# Patient Record
Sex: Female | Born: 1970 | Race: Black or African American | Hispanic: No | Marital: Single | Smoking: Former smoker
Health system: Southern US, Community
[De-identification: ages and names within clinical notes are randomized; demographics above are authoritative.]

## PROBLEM LIST (undated history)

## (undated) DIAGNOSIS — I10 Essential (primary) hypertension: Secondary | ICD-10-CM

## (undated) DIAGNOSIS — E785 Hyperlipidemia, unspecified: Secondary | ICD-10-CM

## (undated) DIAGNOSIS — K219 Gastro-esophageal reflux disease without esophagitis: Secondary | ICD-10-CM

## (undated) DIAGNOSIS — E119 Type 2 diabetes mellitus without complications: Secondary | ICD-10-CM

## (undated) HISTORY — PX: TONSILLECTOMY: SUR1361

## (undated) HISTORY — PX: LEG TENDON SURGERY: SHX1004

## (undated) HISTORY — PX: OTHER SURGICAL HISTORY: SHX169

## (undated) HISTORY — PX: KNEE SURGERY: SHX244

## (undated) HISTORY — PX: ABDOMINAL HYSTERECTOMY: SHX81

---

## 2013-04-14 ENCOUNTER — Emergency Department: Payer: Self-pay | Admitting: Emergency Medicine

## 2013-04-17 LAB — BETA STREP CULTURE(ARMC)

## 2013-06-20 ENCOUNTER — Emergency Department: Payer: Self-pay | Admitting: Emergency Medicine

## 2013-06-20 LAB — CBC
HCT: 38.9 % (ref 35.0–47.0)
HGB: 12.2 g/dL (ref 12.0–16.0)
MCH: 25.9 pg — ABNORMAL LOW (ref 26.0–34.0)
MCHC: 31.2 g/dL — ABNORMAL LOW (ref 32.0–36.0)
MCV: 83 fL (ref 80–100)
Platelet: 318 10*3/uL (ref 150–440)
RBC: 4.7 10*6/uL (ref 3.80–5.20)
RDW: 14.8 % — AB (ref 11.5–14.5)
WBC: 8.3 10*3/uL (ref 3.6–11.0)

## 2013-06-20 LAB — DRUG SCREEN, URINE
Amphetamines, Ur Screen: NEGATIVE (ref ?–1000)
Barbiturates, Ur Screen: NEGATIVE (ref ?–200)
Benzodiazepine, Ur Scrn: NEGATIVE (ref ?–200)
Cannabinoid 50 Ng, Ur ~~LOC~~: NEGATIVE (ref ?–50)
Cocaine Metabolite,Ur ~~LOC~~: POSITIVE (ref ?–300)
MDMA (ECSTASY) UR SCREEN: NEGATIVE (ref ?–500)
Methadone, Ur Screen: NEGATIVE (ref ?–300)
OPIATE, UR SCREEN: NEGATIVE (ref ?–300)
PHENCYCLIDINE (PCP) UR S: NEGATIVE (ref ?–25)
Tricyclic, Ur Screen: NEGATIVE (ref ?–1000)

## 2013-06-20 LAB — ETHANOL: Ethanol: <3 mg/dL

## 2013-06-20 LAB — COMPREHENSIVE METABOLIC PANEL
AST: 17 U/L (ref 15–37)
Albumin: 4 g/dL (ref 3.4–5.0)
Alkaline Phosphatase: 94 U/L
Anion Gap: 7 (ref 7–16)
BILIRUBIN TOTAL: 0.3 mg/dL (ref 0.2–1.0)
BUN: 11 mg/dL (ref 7–18)
Calcium, Total: 9.4 mg/dL (ref 8.5–10.1)
Chloride: 105 mmol/L (ref 98–107)
Co2: 26 mmol/L (ref 21–32)
Creatinine: 1.38 mg/dL — ABNORMAL HIGH (ref 0.60–1.30)
EGFR (African American): 55 — ABNORMAL LOW
GFR CALC NON AF AMER: 47 — AB
Glucose: 89 mg/dL (ref 65–99)
OSMOLALITY: 275 (ref 275–301)
Potassium: 3.7 mmol/L (ref 3.5–5.1)
SGPT (ALT): 21 U/L (ref 12–78)
SODIUM: 138 mmol/L (ref 136–145)
Total Protein: 8 g/dL (ref 6.4–8.2)

## 2013-06-20 LAB — URINALYSIS, COMPLETE
Bilirubin,UR: NEGATIVE
Glucose,UR: NEGATIVE mg/dL (ref 0–75)
KETONE: NEGATIVE
LEUKOCYTE ESTERASE: NEGATIVE
NITRITE: NEGATIVE
Ph: 7 (ref 4.5–8.0)
Protein: NEGATIVE
RBC,UR: 25 /HPF (ref 0–5)
SPECIFIC GRAVITY: 1.006 (ref 1.003–1.030)

## 2013-06-20 LAB — SALICYLATE LEVEL: Salicylates, Serum: <1.7 mg/dL

## 2013-06-20 LAB — ACETAMINOPHEN LEVEL: Acetaminophen: <2 ug/mL

## 2013-09-19 ENCOUNTER — Emergency Department: Payer: Self-pay | Admitting: Emergency Medicine

## 2013-09-19 LAB — URINALYSIS, COMPLETE
Bilirubin,UR: NEGATIVE
Glucose,UR: NEGATIVE mg/dL (ref 0–75)
Ketone: NEGATIVE
Nitrite: NEGATIVE
Ph: 6 (ref 4.5–8.0)
Protein: NEGATIVE
RBC,UR: 1 /HPF (ref 0–5)
SPECIFIC GRAVITY: 1.001 (ref 1.003–1.030)
Squamous Epithelial: 1

## 2013-10-06 ENCOUNTER — Emergency Department: Payer: Self-pay | Admitting: Emergency Medicine

## 2013-10-06 LAB — DRUG SCREEN, URINE
Amphetamines, Ur Screen: NEGATIVE (ref ?–1000)
BENZODIAZEPINE, UR SCRN: NEGATIVE (ref ?–200)
Barbiturates, Ur Screen: NEGATIVE (ref ?–200)
Cannabinoid 50 Ng, Ur ~~LOC~~: NEGATIVE (ref ?–50)
Cocaine Metabolite,Ur ~~LOC~~: POSITIVE (ref ?–300)
MDMA (ECSTASY) UR SCREEN: NEGATIVE (ref ?–500)
Methadone, Ur Screen: NEGATIVE (ref ?–300)
Opiate, Ur Screen: NEGATIVE (ref ?–300)
PHENCYCLIDINE (PCP) UR S: NEGATIVE (ref ?–25)
TRICYCLIC, UR SCREEN: NEGATIVE (ref ?–1000)

## 2013-10-06 LAB — SALICYLATE LEVEL: Salicylates, Serum: <1.7 mg/dL

## 2013-10-06 LAB — URINALYSIS, COMPLETE
BACTERIA: NONE SEEN
BILIRUBIN, UR: NEGATIVE
GLUCOSE, UR: NEGATIVE mg/dL (ref 0–75)
Ketone: NEGATIVE
Leukocyte Esterase: NEGATIVE
Nitrite: NEGATIVE
Ph: 6 (ref 4.5–8.0)
Protein: 30
RBC,UR: 3 /HPF (ref 0–5)
SPECIFIC GRAVITY: 1.029 (ref 1.003–1.030)
Squamous Epithelial: 24
WBC UR: 17 /HPF (ref 0–5)

## 2013-10-06 LAB — COMPREHENSIVE METABOLIC PANEL
ALBUMIN: 4 g/dL (ref 3.4–5.0)
ALK PHOS: 87 U/L
ALT: 22 U/L (ref 12–78)
ANION GAP: 3 — AB (ref 7–16)
BUN: 7 mg/dL (ref 7–18)
Bilirubin,Total: 0.3 mg/dL (ref 0.2–1.0)
CHLORIDE: 106 mmol/L (ref 98–107)
Calcium, Total: 9.1 mg/dL (ref 8.5–10.1)
Co2: 31 mmol/L (ref 21–32)
Creatinine: 1.61 mg/dL — ABNORMAL HIGH (ref 0.60–1.30)
EGFR (Non-African Amer.): 39 — ABNORMAL LOW
GFR CALC AF AMER: 45 — AB
Glucose: 75 mg/dL (ref 65–99)
Osmolality: 276 (ref 275–301)
Potassium: 3.5 mmol/L (ref 3.5–5.1)
SGOT(AST): 20 U/L (ref 15–37)
Sodium: 140 mmol/L (ref 136–145)
TOTAL PROTEIN: 8.4 g/dL — AB (ref 6.4–8.2)

## 2013-10-06 LAB — CBC
HCT: 42.5 % (ref 35.0–47.0)
HGB: 13.3 g/dL (ref 12.0–16.0)
MCH: 26 pg (ref 26.0–34.0)
MCHC: 31.4 g/dL — ABNORMAL LOW (ref 32.0–36.0)
MCV: 83 fL (ref 80–100)
Platelet: 339 10*3/uL (ref 150–440)
RBC: 5.13 10*6/uL (ref 3.80–5.20)
RDW: 14 % (ref 11.5–14.5)
WBC: 6.5 10*3/uL (ref 3.6–11.0)

## 2013-10-06 LAB — ETHANOL: Ethanol: <3 mg/dL

## 2013-10-06 LAB — ACETAMINOPHEN LEVEL: Acetaminophen: <2 ug/mL

## 2013-10-11 LAB — URINALYSIS, COMPLETE
BLOOD: NEGATIVE
Bilirubin,UR: NEGATIVE
Glucose,UR: NEGATIVE mg/dL (ref 0–75)
Ketone: NEGATIVE
Leukocyte Esterase: NEGATIVE
NITRITE: NEGATIVE
PH: 6 (ref 4.5–8.0)
Protein: NEGATIVE
Specific Gravity: 1.012 (ref 1.003–1.030)

## 2013-10-11 LAB — ETHANOL
Ethanol %: <0.003 % (ref 0.000–0.080)
Ethanol: <3 mg/dL

## 2013-10-11 LAB — COMPREHENSIVE METABOLIC PANEL
ALBUMIN: 3.6 g/dL (ref 3.4–5.0)
ANION GAP: 6 — AB (ref 7–16)
AST: 16 U/L (ref 15–37)
Alkaline Phosphatase: 81 U/L
BUN: 7 mg/dL (ref 7–18)
Bilirubin,Total: 0.4 mg/dL (ref 0.2–1.0)
CALCIUM: 9.1 mg/dL (ref 8.5–10.1)
Chloride: 110 mmol/L — ABNORMAL HIGH (ref 98–107)
Co2: 28 mmol/L (ref 21–32)
Creatinine: 1.19 mg/dL (ref 0.60–1.30)
EGFR (Non-African Amer.): 56 — ABNORMAL LOW
Glucose: 100 mg/dL — ABNORMAL HIGH (ref 65–99)
OSMOLALITY: 285 (ref 275–301)
Potassium: 3.8 mmol/L (ref 3.5–5.1)
SGPT (ALT): 20 U/L (ref 12–78)
Sodium: 144 mmol/L (ref 136–145)
Total Protein: 7.8 g/dL (ref 6.4–8.2)

## 2013-10-11 LAB — DRUG SCREEN, URINE
Amphetamines, Ur Screen: NEGATIVE (ref ?–1000)
BARBITURATES, UR SCREEN: NEGATIVE (ref ?–200)
BENZODIAZEPINE, UR SCRN: NEGATIVE (ref ?–200)
Cannabinoid 50 Ng, Ur ~~LOC~~: NEGATIVE (ref ?–50)
Cocaine Metabolite,Ur ~~LOC~~: POSITIVE (ref ?–300)
MDMA (Ecstasy)Ur Screen: NEGATIVE (ref ?–500)
METHADONE, UR SCREEN: NEGATIVE (ref ?–300)
Opiate, Ur Screen: NEGATIVE (ref ?–300)
PHENCYCLIDINE (PCP) UR S: NEGATIVE (ref ?–25)
Tricyclic, Ur Screen: NEGATIVE (ref ?–1000)

## 2013-10-11 LAB — CBC
HCT: 38.5 % (ref 35.0–47.0)
HGB: 12.4 g/dL (ref 12.0–16.0)
MCH: 26.6 pg (ref 26.0–34.0)
MCHC: 32.2 g/dL (ref 32.0–36.0)
MCV: 83 fL (ref 80–100)
PLATELETS: 300 10*3/uL (ref 150–440)
RBC: 4.66 10*6/uL (ref 3.80–5.20)
RDW: 13.9 % (ref 11.5–14.5)
WBC: 6.9 10*3/uL (ref 3.6–11.0)

## 2013-10-11 LAB — SALICYLATE LEVEL: Salicylates, Serum: <1.7 mg/dL

## 2013-10-11 LAB — ACETAMINOPHEN LEVEL: Acetaminophen: <2 ug/mL

## 2013-10-11 LAB — TROPONIN I

## 2013-10-12 ENCOUNTER — Inpatient Hospital Stay: Payer: Self-pay | Admitting: Psychiatry

## 2013-10-12 LAB — TROPONIN I

## 2013-10-14 LAB — HEMOGLOBIN A1C: Hemoglobin A1C: 6.1 % (ref 4.2–6.3)

## 2014-04-04 ENCOUNTER — Emergency Department: Payer: Self-pay | Admitting: Emergency Medicine

## 2014-04-04 LAB — BASIC METABOLIC PANEL
ANION GAP: 5 — AB (ref 7–16)
BUN: 10 mg/dL (ref 7–18)
CO2: 24 mmol/L (ref 21–32)
Calcium, Total: 8.7 mg/dL (ref 8.5–10.1)
Chloride: 111 mmol/L — ABNORMAL HIGH (ref 98–107)
Creatinine: 1.22 mg/dL (ref 0.60–1.30)
EGFR (Non-African Amer.): 51 — ABNORMAL LOW
Glucose: 96 mg/dL (ref 65–99)
OSMOLALITY: 278 (ref 275–301)
POTASSIUM: 4.3 mmol/L (ref 3.5–5.1)
SODIUM: 140 mmol/L (ref 136–145)

## 2014-04-04 LAB — CBC
HCT: 39.5 % (ref 35.0–47.0)
HGB: 12.4 g/dL (ref 12.0–16.0)
MCH: 26.9 pg (ref 26.0–34.0)
MCHC: 31.4 g/dL — AB (ref 32.0–36.0)
MCV: 86 fL (ref 80–100)
PLATELETS: 316 10*3/uL (ref 150–440)
RBC: 4.59 10*6/uL (ref 3.80–5.20)
RDW: 14.3 % (ref 11.5–14.5)
WBC: 5.8 10*3/uL (ref 3.6–11.0)

## 2014-04-04 LAB — TROPONIN I

## 2014-05-12 ENCOUNTER — Ambulatory Visit: Payer: Self-pay | Admitting: Nurse Practitioner

## 2014-08-01 NOTE — H&P (Signed)
PATIENT NAME:  Rachael Strickland, Rachael Strickland MR#:  [id number] DATE OF BIRTH:  1971-01-28  DATE OF ADMISSION:  10/12/2013  SEX: Female.  RACE: African-American.  AGE: 44 years.  INITIAL PSYCHIATRIC EVALUATION:  IDENTIFYING INFORMATION: The patient is a 44 year old African-American female who is not employed, not married and has been living with current boyfriend for 3 years. The patient has a long history of mental illness and had several inpatient hospitalizations on psychiatry for the same. The patient was brought to the Emergency Room at [facility] by her boyfriend after he took away a bottle of sugar pills from her, as she wanted to overdose herself on sugar pills so that her blood sugar would become very low and she would collapse and die.  HISTORY OF PRESENT ILLNESS: The patient reports that she has been feeling stressed out and depressed because she is not able to really talk to any of her family members including her mother, who do not want to talk to her. She is not able to see her 2 children, who live with their daddy and he will not let her see them.   PAST PSYCHIATRIC HISTORY: Several inpatient hospitalizations in psychiatry; once in [place], [place], for 3 months because of suicidal ideas and was inpatient for 3 days at [facility] system in the past. History of suicide attempts on several occasions by overdose on pills, but some of them were not documented. Not being followed by any psychiatrist at this time.   FAMILY HISTORY OF MENTAL ILLNESS: None known. No known history of suicide in the family.  FAMILY HISTORY: Parents got divorced with the patient was very young; was raised by her mother. Mother worked. Mother is living, but she will not talk to her. The patient has 2 sisters and 1 brother died this month and this caused her to become depressed with complicated bereavement as in not able to deal with the same.   PERSONAL HISTORY: Born in [place], [place]. Graduated from high school.  No college.   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 Quit working because her children's father cheated on her and she could not deal with it any more. Last worked in 2008.  MILITARY HISTORY: None.  MARRIAGES: Never married. Has 2 children and they live with their father who will not let her see them.  ALCOHOL AND DRUGS: Denies any alcohol. Admits that she tried cocaine recently on a couple of occasions. Denies smoking nicotine cigarettes.  MEDICAL HISTORY: Has high blood pressure, has diabetes mellitus. Status post hysterectomy, 6 surgeries on the right knee, status post carpal tunnel surgery on both wrists, status post tonsillectomy. No history of motor vehicle accident, never been unconscious.  ALLERGIES: ALLERGIC TO SULFA DRUGS.  Doctor in [place] was giving her medication for hypertension and diabetes, and has to find a doctor in [place] soon.  PHYSICAL EXAMINATION: VITAL SIGNS: Temperature 98.4, pulse is 82 per minute and regular, respirations 20 per minute and regular, blood pressure is 130/80 mmHg. HEENT: Normocephalic, atraumatic. Eyes: PERRLA. NECK: Supple without any thyromegaly. CHEST: Normal expansion, normal breath sounds. HEART: Normal S1 without any murmurs, rubs, or gallops.  ABDOMEN: Soft, no organomegaly. Bowel sounds heard. RECTAL/PELVIC: Deferred. NEUROLOGIC: Gait is normal. Romberg is negative. Cranial nerves II through XII grossly intact. DTRs 2+ normal.  MENTAL STATUS EXAMINATION: The patient is dressed in hospital scrubs, alert and oriented to place, person and time, appears upset and irritable, as she did not want to stay here, as she changed her  mind and she wanted to go home. Affect is flat. Mood restricted and irritable about her situation in life, which is going to her brother's death and not able to see her children and not able to relate with her family. Feels hopeless and helpless. Feels worthless and useless at times. Did express suicidal  wishes and thoughts, though she contracts for safety. No psychosis. Does not appear to be responding to internal stimuli. Cognition intact. Insight and judgment poor. Impulse control poor.  IMPRESSION:  AXIS I:  Major depressive disorder, recurrent, with suicidal ideas.  Cocaine abuse and tried it on couple of occasions. Bereavement after death of her brother recently. AXIS II: Deferred. AXIS III:  Status post tonsillectomy.  Status post 6 surgeries on right knee. Status post hysterectomy. Hypertension. Diabetes mellitus. AXIS IV: Severe. Not able to relate with her family members as well as physical problems. Not able to see her children and recent death of brother and dealing with bereavement. AXIS V: GAF of 25.  PLAN: The patient is admitted to [facility] for a closer evaluation and help. She will be started back on her medications which will be adjusted during her stay in the hospital. During her stay in the hospital, she will be given milieu therapy and supportive counseling with coping skills in dealing with the death of her brother and grief counseling will be given. Social services will try to contact the family to see if there is any family counseling that can be given. At the time of discharge, the patient is stabilized. An appropriate followup appointment will be made in the community.    ____________________________ Jannet MantisSurya K. Guss Bundehalla, MD skc:lm D: 10/12/2013 19:50:09 ET T: 10/12/2013 20:16:23 ET JOB#: [id number]  cc: Monika SalkSurya K. Guss Bundehalla, MD, <Dictator> Beau FannySURYA K Jaidence Geisler MD ELECTRONICALLY SIGNED 10/13/2013 11:29

## 2014-08-01 NOTE — Consult Note (Signed)
PATIENT NAME:  Rachael Strickland, Ambra MR#:  [id number] DATE OF BIRTH:  11/16/1970  DATE OF CONSULTATION:  10/11/2013  CONSULTING PHYSICIAN:  Tandre Conly K. Deolinda Frid, MD  SEX: Female.  RACE: African American.  SUBJECTIVE: The patient was seen in consultation in [facility] Emergency Room 25. The patient is a 44 year old African American female not employed, not married, and has been living with current boyfriend for 3 years. The patient has a long history of mental illness and has several inpatient holds on psychiatry for the same. The patient was [facility] by her boyfriend after he took away a bottle of sugar pills from her as she wanted to overdose herself on sugar pills so that her blood sugar would become very low and she collapses and she dies.  CHIEF COMPLAINT: "I'm feeling depressed and stressed out about various stressors of life."  HISTORY OF PRESENT ILLNESS: The patient reports that she is not able to relate or talk to any of her family members as her mother does not talk to her. She is not able to see her 2 children who live with their daddy and he will not let her see them.  PAST PSYCHIATRIC HISTORY: Several inpatient holds on psychiatry. Once before in [place], for 3 months because of suicidal ideas, was [facility]. History of suicide attempts on several occasions by overdose on pills but some of them were not documented. Not being followed by any psychiatrist at this time.  ALCOHOL AND DRUGS: Denies drinking alcohol. Admits that she tried cocaine recently on a couple of occasions.   MENTAL STATUS EXAMINATION: The patient is alert and oriented to place, person, and time. Aware of situation brought her to [facility]. Affect is flat. Mood restricted. Admits feeling hopeless and helpless, admits feeling worthless and useless. Does have suicidal wishes and thoughts and does not contract for safety. No psychosis. Denies auditory or visual  hallucinations, delusions or paranoid thinking. Memory is intact. Cognition intact. Insight and judgment guarded. Impulse control poor.   IMPRESSION: Major depressive disorder, recurrent, with suicidal ideas but contracts for safety as long as she is here.  RECOMMEND: Inpatient hold on psychiatry for closer observation eval and help. Behavioral Health and stabilization.    ____________________________ Jannet MantisSurya K. Guss Bundehalla, MD skc:lt D: 10/11/2013 18:36:26 ET T: 10/12/2013 00:26:21 ET JOB#: [id number]  cc: Monika SalkSurya K. Guss Bundehalla, MD, <Dictator> Beau FannySURYA K Kristee Angus MD ELECTRONICALLY SIGNED 10/12/2013 17:58

## 2014-08-01 NOTE — Consult Note (Signed)
PATIENT NAME:  Rachael Strickland, Rachael Strickland MR#:  [id number] DATE OF BIRTH:  01-25-1971  DATE OF CONSULTATION:  06/21/2013  REFERRING PHYSICIAN:   CONSULTING PHYSICIAN:  Autumnrose Yore K. Guss Bundehalla, MD  PLACE OF DICTATION: [facility] [place]  AGE: 43. SEX: Female. RACE: African American.  SUBJECTIVE: The patient was in consultation in ER.. The patient is a 44 year old African American female, not employed and is waiting for a disability check. The patient has been diagnosed with bipolar disorder for many years and has been on medications. The patient reports that she [place] her [place] and her boyfriend's hours were cut [facility] at [employer] and so they lost the place where they have been living, which is a room in a house. The patient currently moved into a homeless shelter. The patient comes in with a chief complaint of being anxious, having bipolar and being scared.   HISTORY OF PRESENT ILLNESS: The patient reports that she has not been getting her medications since December 1214, since [place] and she had been stable on the medications and they have been helping her.   ALCOHOL AND DRUGS: Denies drinking alcohol; however, she reported to the staff that she tried crack cocaine on one occasion and does not use it on a regular basis.   MENTAL STATUS EXAMINATION: The patient is dressed in hospital clothes, alert and oriented, calm, pleasant and cooperative. No agitation. Affect is neutral. Mood stable. Denies being depressed. Denies being hopeless or helpless. However, she feels worried because of financial stressors, being homeless and having to live at a homeless shelter and not able to care for her 44 year old daughter. No psychosis. Denies auditory or visual hallucinations. Denies hearing voices. or seeing things.. . Denies having paranoid ideas.. Feeling nervous and anxious since she is not on her medications and not able to  rest and not able to sleep at night.   IMPRESSION: Bipolar disorder, current episode depressed.   PLAN: I will restart the patient back on all of her medications. The patient will get her medications tonight and if she continues to stay stable, Mr. Theodosia PalingKent Smith is to evaluate the patient on Monday, 06/23/2013, for  after discharge and followup plan in [place]. ____________________________ Jannet MantisSurya K. Guss Bundehalla, MD skc:aw D: 06/21/2013 20:46:48 ET T: 06/22/2013 12:14:11 ET JOB#: [id number]  cc: Monika SalkSurya K. Guss Bundehalla, MD, <Dictator> Beau FannySURYA K Aleja Yearwood MD ELECTRONICALLY SIGNED 06/22/2013 19:30

## 2014-09-10 ENCOUNTER — Other Ambulatory Visit: Payer: Self-pay | Admitting: Orthopedic Surgery

## 2014-09-10 DIAGNOSIS — M1731 Unilateral post-traumatic osteoarthritis, right knee: Secondary | ICD-10-CM

## 2014-09-15 ENCOUNTER — Ambulatory Visit: Payer: Self-pay

## 2014-09-18 ENCOUNTER — Ambulatory Visit
Admission: RE | Admit: 2014-09-18 | Discharge: 2014-09-18 | Disposition: A | Discharge status: Home / Self Care | Payer: Medicare Other | Source: Ambulatory Visit | Attending: Orthopedic Surgery | Admitting: Orthopedic Surgery

## 2014-09-18 DIAGNOSIS — M1731 Unilateral post-traumatic osteoarthritis, right knee: Secondary | ICD-10-CM

## 2014-09-18 DIAGNOSIS — M1711 Unilateral primary osteoarthritis, right knee: Secondary | ICD-10-CM | POA: Diagnosis not present

## 2014-10-14 ENCOUNTER — Encounter
Admission: RE | Admit: 2014-10-14 | Discharge: 2014-10-14 | Disposition: A | Discharge status: Home / Self Care | Payer: Medicare Other | Source: Ambulatory Visit | Attending: Orthopedic Surgery | Admitting: Orthopedic Surgery

## 2014-10-14 DIAGNOSIS — Z01810 Encounter for preprocedural cardiovascular examination: Secondary | ICD-10-CM | POA: Insufficient documentation

## 2014-10-14 DIAGNOSIS — Z01812 Encounter for preprocedural laboratory examination: Secondary | ICD-10-CM | POA: Insufficient documentation

## 2014-10-14 HISTORY — DX: Gastro-esophageal reflux disease without esophagitis: K21.9

## 2014-10-14 HISTORY — DX: Hyperlipidemia, unspecified: E78.5

## 2014-10-14 HISTORY — DX: Type 2 diabetes mellitus without complications: E11.9

## 2014-10-14 HISTORY — DX: Essential (primary) hypertension: I10

## 2014-10-14 LAB — URINALYSIS COMPLETE WITH MICROSCOPIC (ARMC ONLY)
Bacteria, UA: NONE SEEN
Bilirubin Urine: NEGATIVE
Glucose, UA: NEGATIVE mg/dL
HGB URINE DIPSTICK: NEGATIVE
KETONES UR: NEGATIVE mg/dL
Leukocytes, UA: NEGATIVE
Nitrite: NEGATIVE
PH: 7 (ref 5.0–8.0)
Protein, ur: NEGATIVE mg/dL
Specific Gravity, Urine: 1.005 (ref 1.005–1.030)

## 2014-10-14 LAB — CBC
HCT: 38 % (ref 35.0–47.0)
Hemoglobin: 12.1 g/dL (ref 12.0–16.0)
MCH: 26.4 pg (ref 26.0–34.0)
MCHC: 31.8 g/dL — ABNORMAL LOW (ref 32.0–36.0)
MCV: 83.1 fL (ref 80.0–100.0)
Platelets: 307 10*3/uL (ref 150–440)
RBC: 4.58 MIL/uL (ref 3.80–5.20)
RDW: 13.8 % (ref 11.5–14.5)
WBC: 6.6 10*3/uL (ref 3.6–11.0)

## 2014-10-14 LAB — URINE DRUG SCREEN, QUALITATIVE (ARMC ONLY)
Amphetamines, Ur Screen: NOT DETECTED
Barbiturates, Ur Screen: NOT DETECTED
Benzodiazepine, Ur Scrn: NOT DETECTED
Cannabinoid 50 Ng, Ur ~~LOC~~: NOT DETECTED
Cocaine Metabolite,Ur ~~LOC~~: POSITIVE — AB
MDMA (Ecstasy)Ur Screen: NOT DETECTED
Methadone Scn, Ur: NOT DETECTED
Opiate, Ur Screen: NOT DETECTED
Phencyclidine (PCP) Ur S: NOT DETECTED
TRICYCLIC, UR SCREEN: NOT DETECTED

## 2014-10-14 LAB — TYPE AND SCREEN
ABO/RH(D): O POS
Antibody Screen: NEGATIVE

## 2014-10-14 LAB — BASIC METABOLIC PANEL
ANION GAP: 6 (ref 5–15)
BUN: 9 mg/dL (ref 6–20)
CALCIUM: 9 mg/dL (ref 8.9–10.3)
CHLORIDE: 107 mmol/L (ref 101–111)
CO2: 24 mmol/L (ref 22–32)
Creatinine, Ser: 1.29 mg/dL — ABNORMAL HIGH (ref 0.44–1.00)
GFR calc non Af Amer: 50 mL/min — ABNORMAL LOW (ref >60)
GFR, EST AFRICAN AMERICAN: 58 mL/min — AB (ref >60)
Glucose, Bld: 99 mg/dL (ref 65–99)
POTASSIUM: 3.3 mmol/L — AB (ref 3.5–5.1)
Sodium: 137 mmol/L (ref 135–145)

## 2014-10-14 LAB — SURGICAL PCR SCREEN
MRSA, PCR: NEGATIVE
Staphylococcus aureus: NEGATIVE

## 2014-10-14 LAB — SEDIMENTATION RATE: SED RATE: 48 mm/h — AB (ref 0–20)

## 2014-10-14 LAB — PROTIME-INR
INR: 1.02
PROTHROMBIN TIME: 13.6 s (ref 11.4–15.0)

## 2014-10-14 LAB — ABO/RH: ABO/RH(D): O POS

## 2014-10-14 LAB — APTT: aPTT: 28 seconds (ref 24–36)

## 2014-10-14 NOTE — Patient Instructions (Signed)
  Your procedure is scheduled on: 10/27/14 Tues Report to Day Surgery. To find out your arrival time please call [phone] between 1PM - 3PM on 10/26/14 Mon.  Remember: Instructions that are not followed completely may result in serious medical risk, up to and including death, or upon the discretion of your surgeon and anesthesiologist your surgery may need to be rescheduled.    _x___ 1. Do not eat food or drink liquids after midnight. No gum chewing or hard candies.     ____ 2. No Alcohol for 24 hours before or after surgery.   ____ 3. Bring all medications with you on the day of surgery if instructed.    _x___ 4. Notify your doctor if there is any change in your medical condition     (cold, fever, infections).     Do not wear jewelry, make-up, hairpins, clips or nail polish.  Do not wear lotions, powders, or perfumes. You may wear deodorant.  Do not shave 48 hours prior to surgery. Men may shave face and neck.  Do not bring valuables to the hospital.    [facility] is not responsible for any belongings or valuables.               Contacts, dentures or bridgework may not be worn into surgery.  Leave your suitcase in the car. After surgery it may be brought to your room.  For patients admitted to the hospital, discharge time is determined by your                treatment team.   Patients discharged the day of surgery will not be allowed to drive home.   Please read over the following fact sheets that you were given:   MRSA Information   ____ Take these medicines the morning of surgery with A SIP OF WATER:    1. lisinopril (PRINIVIL,ZESTRIL) 2.5 MG tablet  2. ranitidine (ZANTAC) 150 MG capsule  3. topiramate (TOPAMAX) 100 MG tablet  4.  5.  6.  ____ Fleet Enema (as directed)   _x__ Use CHG Soap as directed  ____ Use inhalers on the day of surgery  _x___ Stop metformin 2 days prior to surgery    ____ Take 1/2 of usual insulin dose the night before surgery and none on  the morning of surgery.   ____ Stop Coumadin/Plavix/aspirin on   ____ Stop Anti-inflammatories on    ____ Stop supplements until after surgery.    ____ Bring C-Pap to the hospital.

## 2014-10-14 NOTE — OR Nursing (Signed)
Faxed request for potassium supplement to Dr Rosita KeaMenz.

## 2014-10-15 NOTE — OR Nursing (Signed)
Anesthesia notified regarding positive cocaine in urine.  "Get urine drug screen on am of surgery." Dr Noralyn Pickarroll

## 2014-10-15 NOTE — OR Nursing (Signed)
Called [facility] regarding positive cocaine in urine, need for potassium supplement, and regarding elevated ESR.

## 2014-10-16 LAB — URINE CULTURE

## 2014-10-27 ENCOUNTER — Inpatient Hospital Stay
Admission: RE | Admit: 2014-10-27 | Discharge: 2014-10-30 | DRG: 470 | Disposition: A | Discharge status: Skilled Nursing Facility | Payer: Medicare Other | Source: Ambulatory Visit | Attending: Orthopedic Surgery | Admitting: Orthopedic Surgery

## 2014-10-27 ENCOUNTER — Encounter: Payer: Self-pay | Admitting: *Deleted

## 2014-10-27 ENCOUNTER — Encounter
Admission: RE | Disposition: A | Discharge status: Skilled Nursing Facility | Payer: Self-pay | Source: Ambulatory Visit | Attending: Orthopedic Surgery

## 2014-10-27 ENCOUNTER — Inpatient Hospital Stay: Payer: Medicare Other | Admitting: Certified Registered"

## 2014-10-27 ENCOUNTER — Inpatient Hospital Stay: Payer: Medicare Other

## 2014-10-27 DIAGNOSIS — D62 Acute posthemorrhagic anemia: Secondary | ICD-10-CM | POA: Diagnosis not present

## 2014-10-27 DIAGNOSIS — Z833 Family history of diabetes mellitus: Secondary | ICD-10-CM

## 2014-10-27 DIAGNOSIS — Z6841 Body Mass Index (BMI) 40.0 and over, adult: Secondary | ICD-10-CM | POA: Diagnosis not present

## 2014-10-27 DIAGNOSIS — F329 Major depressive disorder, single episode, unspecified: Secondary | ICD-10-CM | POA: Diagnosis present

## 2014-10-27 DIAGNOSIS — Z882 Allergy status to sulfonamides status: Secondary | ICD-10-CM

## 2014-10-27 DIAGNOSIS — I10 Essential (primary) hypertension: Secondary | ICD-10-CM | POA: Diagnosis present

## 2014-10-27 DIAGNOSIS — E119 Type 2 diabetes mellitus without complications: Secondary | ICD-10-CM | POA: Diagnosis present

## 2014-10-27 DIAGNOSIS — M12569 Traumatic arthropathy, unspecified knee: Secondary | ICD-10-CM | POA: Diagnosis present

## 2014-10-27 DIAGNOSIS — K219 Gastro-esophageal reflux disease without esophagitis: Secondary | ICD-10-CM | POA: Diagnosis present

## 2014-10-27 DIAGNOSIS — Z79899 Other long term (current) drug therapy: Secondary | ICD-10-CM | POA: Diagnosis not present

## 2014-10-27 DIAGNOSIS — M1731 Unilateral post-traumatic osteoarthritis, right knee: Principal | ICD-10-CM | POA: Diagnosis present

## 2014-10-27 DIAGNOSIS — Z9889 Other specified postprocedural states: Secondary | ICD-10-CM

## 2014-10-27 DIAGNOSIS — Z886 Allergy status to analgesic agent status: Secondary | ICD-10-CM

## 2014-10-27 DIAGNOSIS — Z87891 Personal history of nicotine dependence: Secondary | ICD-10-CM | POA: Diagnosis not present

## 2014-10-27 DIAGNOSIS — G8918 Other acute postprocedural pain: Secondary | ICD-10-CM

## 2014-10-27 DIAGNOSIS — E669 Obesity, unspecified: Secondary | ICD-10-CM | POA: Diagnosis present

## 2014-10-27 DIAGNOSIS — E785 Hyperlipidemia, unspecified: Secondary | ICD-10-CM | POA: Diagnosis present

## 2014-10-27 HISTORY — PX: TOTAL KNEE ARTHROPLASTY: SHX125

## 2014-10-27 LAB — URINE DRUG SCREEN, QUALITATIVE (ARMC ONLY)
AMPHETAMINES, UR SCREEN: NOT DETECTED
Barbiturates, Ur Screen: NOT DETECTED
Benzodiazepine, Ur Scrn: NOT DETECTED
Cannabinoid 50 Ng, Ur ~~LOC~~: NOT DETECTED
Cocaine Metabolite,Ur ~~LOC~~: NOT DETECTED
MDMA (Ecstasy)Ur Screen: NOT DETECTED
Methadone Scn, Ur: NOT DETECTED
Opiate, Ur Screen: NOT DETECTED
Phencyclidine (PCP) Ur S: NOT DETECTED
Tricyclic, Ur Screen: NOT DETECTED

## 2014-10-27 LAB — I-STAT 4 (NA, K, GLUC, HGB, HCT)
HCT, POC: 37 % (ref 34.0–46.6)
HEMOGLOBIN: 12.69 g/dL (ref 11.1–15.9)
POTASSIUM: 6.2 mmol/L
Sodium: 139

## 2014-10-27 LAB — GLUCOSE, CAPILLARY
GLUCOSE-CAPILLARY: 103 mg/dL — AB (ref 65–99)
GLUCOSE-CAPILLARY: 98 mg/dL (ref 65–99)
GLUCOSE-CAPILLARY: 71 mg/dL (ref 65–99)
Glucose-Capillary: 106 mg/dL — ABNORMAL HIGH (ref 65–99)

## 2014-10-27 LAB — CBC
HCT: 37.9 % (ref 35.0–47.0)
Hemoglobin: 12 g/dL (ref 12.0–16.0)
MCH: 26.5 pg (ref 26.0–34.0)
MCHC: 31.6 g/dL — ABNORMAL LOW (ref 32.0–36.0)
MCV: 83.9 fL (ref 80.0–100.0)
Platelets: 294 10*3/uL (ref 150–440)
RBC: 4.52 MIL/uL (ref 3.80–5.20)
RDW: 14.6 % — ABNORMAL HIGH (ref 11.5–14.5)
WBC: 7.1 10*3/uL (ref 3.6–11.0)

## 2014-10-27 LAB — CREATININE, SERUM
CREATININE: 1.14 mg/dL — AB (ref 0.44–1.00)
GFR calc Af Amer: >60 mL/min (ref >60)
GFR calc non Af Amer: 58 mL/min — ABNORMAL LOW (ref >60)

## 2014-10-27 LAB — POCT PREGNANCY, URINE: Preg Test, Ur: NEGATIVE

## 2014-10-27 SURGERY — ARTHROPLASTY, KNEE, TOTAL
Anesthesia: Spinal | Site: Knee | Laterality: Right | Wound class: Clean

## 2014-10-27 MED ORDER — MIDAZOLAM HCL 5 MG/5ML IJ SOLN
INTRAMUSCULAR | Status: DC | PRN
  Administered 2014-10-27: 2 mg via INTRAVENOUS

## 2014-10-27 MED ORDER — ONDANSETRON HCL 4 MG/2ML IJ SOLN: 4.0000 mg | Freq: Once | INTRAMUSCULAR | Status: DC | PRN

## 2014-10-27 MED ORDER — ACETAMINOPHEN 325 MG PO TABS: 650.0000 mg | ORAL_TABLET | Freq: Four times a day (QID) | ORAL | Status: DC | PRN

## 2014-10-27 MED ORDER — MAGNESIUM HYDROXIDE 400 MG/5ML PO SUSP: 30.0000 mL | Freq: Every day | ORAL | Status: DC | PRN

## 2014-10-27 MED ORDER — FENTANYL CITRATE (PF) 100 MCG/2ML IJ SOLN: 25.0000 ug | INTRAMUSCULAR | Status: DC | PRN

## 2014-10-27 MED ORDER — BUPIVACAINE LIPOSOME 1.3 % IJ SUSP
INTRAMUSCULAR | Status: AC
  Filled 2014-10-27: qty 20

## 2014-10-27 MED ORDER — MAGNESIUM CITRATE PO SOLN: 1.0000 | Freq: Once | ORAL | Status: AC | PRN

## 2014-10-27 MED ORDER — MORPHINE SULFATE 2 MG/ML IJ SOLN
2.0000 mg | INTRAMUSCULAR | Status: DC | PRN
  Administered 2014-10-28 – 2014-10-29 (×2): 2 mg via INTRAVENOUS
  Filled 2014-10-27 (×2): qty 1

## 2014-10-27 MED ORDER — INSULIN ASPART 100 UNIT/ML ~~LOC~~ SOLN: 0.0000 [IU] | Freq: Three times a day (TID) | SUBCUTANEOUS | Status: DC

## 2014-10-27 MED ORDER — FAMOTIDINE 20 MG PO TABS
10.0000 mg | ORAL_TABLET | Freq: Every day | ORAL | Status: DC
  Administered 2014-10-27 – 2014-10-30 (×4): 10 mg via ORAL
  Filled 2014-10-27 (×4): qty 1

## 2014-10-27 MED ORDER — ENOXAPARIN SODIUM 40 MG/0.4ML ~~LOC~~ SOLN
40.0000 mg | Freq: Two times a day (BID) | SUBCUTANEOUS | Status: DC
  Administered 2014-10-28 – 2014-10-30 (×5): 40 mg via SUBCUTANEOUS
  Filled 2014-10-27 (×5): qty 0.4

## 2014-10-27 MED ORDER — TOPIRAMATE 25 MG PO TABS
150.0000 mg | ORAL_TABLET | Freq: Every day | ORAL | Status: DC
  Administered 2014-10-27 – 2014-10-29 (×3): 150 mg via ORAL
  Filled 2014-10-27 (×3): qty 6

## 2014-10-27 MED ORDER — SODIUM CHLORIDE 0.9 % IJ SOLN
INTRAMUSCULAR | Status: AC
  Filled 2014-10-27: qty 100

## 2014-10-27 MED ORDER — METOCLOPRAMIDE HCL 5 MG/ML IJ SOLN: 5.0000 mg | Freq: Three times a day (TID) | INTRAMUSCULAR | Status: DC | PRN

## 2014-10-27 MED ORDER — ZOLPIDEM TARTRATE 5 MG PO TABS: 5.0000 mg | ORAL_TABLET | Freq: Every evening | ORAL | Status: DC | PRN

## 2014-10-27 MED ORDER — BISACODYL 10 MG RE SUPP: 10.0000 mg | Freq: Every day | RECTAL | Status: DC | PRN

## 2014-10-27 MED ORDER — SODIUM CHLORIDE 0.9 % IV SOLN
INTRAVENOUS | Status: DC | PRN
  Administered 2014-10-27: 60 mL

## 2014-10-27 MED ORDER — HYDROCHLOROTHIAZIDE 25 MG PO TABS
25.0000 mg | ORAL_TABLET | Freq: Every day | ORAL | Status: DC
  Administered 2014-10-28 – 2014-10-30 (×3): 25 mg via ORAL
  Filled 2014-10-27 (×3): qty 1

## 2014-10-27 MED ORDER — SODIUM CHLORIDE 0.9 % IV SOLN
INTRAVENOUS | Status: DC
  Administered 2014-10-27: 13:00:00 via INTRAVENOUS

## 2014-10-27 MED ORDER — CEFAZOLIN SODIUM 1-5 GM-% IV SOLN
INTRAVENOUS | Status: AC
  Filled 2014-10-27: qty 100

## 2014-10-27 MED ORDER — MENTHOL 3 MG MT LOZG: 1.0000 | LOZENGE | OROMUCOSAL | Status: DC | PRN

## 2014-10-27 MED ORDER — MORPHINE SULFATE 10 MG/ML IJ SOLN
INTRAMUSCULAR | Status: AC
  Filled 2014-10-27: qty 1

## 2014-10-27 MED ORDER — DOCUSATE SODIUM 100 MG PO CAPS
100.0000 mg | ORAL_CAPSULE | Freq: Two times a day (BID) | ORAL | Status: DC
  Administered 2014-10-27 – 2014-10-30 (×6): 100 mg via ORAL
  Filled 2014-10-27 (×6): qty 1

## 2014-10-27 MED ORDER — METHOCARBAMOL 500 MG PO TABS
500.0000 mg | ORAL_TABLET | Freq: Four times a day (QID) | ORAL | Status: DC | PRN
  Administered 2014-10-28 (×2): 500 mg via ORAL
  Filled 2014-10-27 (×2): qty 1

## 2014-10-27 MED ORDER — NEOMYCIN-POLYMYXIN B GU 40-200000 IR SOLN
Status: DC | PRN
  Administered 2014-10-27: 16 mL

## 2014-10-27 MED ORDER — TOPIRAMATE 25 MG PO TABS
50.0000 mg | ORAL_TABLET | Freq: Every day | ORAL | Status: DC
  Administered 2014-10-27 – 2014-10-30 (×4): 50 mg via ORAL
  Filled 2014-10-27 (×4): qty 2

## 2014-10-27 MED ORDER — METFORMIN HCL ER 500 MG PO TB24
500.0000 mg | ORAL_TABLET | Freq: Two times a day (BID) | ORAL | Status: DC
Start: 2014-10-27 — End: 2014-10-30
  Administered 2014-10-27 – 2014-10-30 (×6): 500 mg via ORAL
  Filled 2014-10-27 (×6): qty 1

## 2014-10-27 MED ORDER — DIPHENHYDRAMINE HCL 12.5 MG/5ML PO ELIX: 12.5000 mg | ORAL_SOLUTION | ORAL | Status: DC | PRN

## 2014-10-27 MED ORDER — CEFAZOLIN SODIUM-DEXTROSE 2-3 GM-% IV SOLR
2.0000 g | Freq: Four times a day (QID) | INTRAVENOUS | Status: AC
  Administered 2014-10-27 – 2014-10-28 (×3): 2 g via INTRAVENOUS
  Filled 2014-10-27 (×3): qty 50

## 2014-10-27 MED ORDER — PROPOFOL INFUSION 10 MG/ML OPTIME
INTRAVENOUS | Status: DC | PRN
  Administered 2014-10-27 (×2): via INTRAVENOUS
  Administered 2014-10-27: 75 ug/kg/min via INTRAVENOUS

## 2014-10-27 MED ORDER — DEXTROSE 5 % IV SOLN: 500.0000 mg | Freq: Four times a day (QID) | INTRAVENOUS | Status: DC | PRN

## 2014-10-27 MED ORDER — FENTANYL CITRATE (PF) 100 MCG/2ML IJ SOLN
INTRAMUSCULAR | Status: DC | PRN
  Administered 2014-10-27: 25 ug via INTRAVENOUS
  Administered 2014-10-27: 50 ug via INTRAVENOUS
  Administered 2014-10-27: 25 ug via INTRAVENOUS

## 2014-10-27 MED ORDER — KETAMINE HCL 50 MG/ML IJ SOLN
INTRAMUSCULAR | Status: DC | PRN
  Administered 2014-10-27: 50 mg via INTRAVENOUS

## 2014-10-27 MED ORDER — LIDOCAINE HCL (PF) 2 % IJ SOLN
INTRAMUSCULAR | Status: DC | PRN
  Administered 2014-10-27: 50 mg

## 2014-10-27 MED ORDER — ATORVASTATIN CALCIUM 20 MG PO TABS
20.0000 mg | ORAL_TABLET | Freq: Every day | ORAL | Status: DC
Start: 2014-10-27 — End: 2014-10-30
  Administered 2014-10-27 – 2014-10-29 (×3): 20 mg via ORAL
  Filled 2014-10-27 (×3): qty 1

## 2014-10-27 MED ORDER — ONDANSETRON HCL 4 MG/2ML IJ SOLN: 4.0000 mg | Freq: Four times a day (QID) | INTRAMUSCULAR | Status: DC | PRN

## 2014-10-27 MED ORDER — BUPIVACAINE-EPINEPHRINE (PF) 0.25% -1:200000 IJ SOLN
INTRAMUSCULAR | Status: AC
  Filled 2014-10-27: qty 30

## 2014-10-27 MED ORDER — METOCLOPRAMIDE HCL 10 MG PO TABS: 5.0000 mg | ORAL_TABLET | Freq: Three times a day (TID) | ORAL | Status: DC | PRN

## 2014-10-27 MED ORDER — PHENOL 1.4 % MT LIQD: 1.0000 | OROMUCOSAL | Status: DC | PRN

## 2014-10-27 MED ORDER — TRANEXAMIC ACID 1000 MG/10ML IV SOLN
1000.0000 mg | Freq: Once | INTRAVENOUS | Status: AC
  Administered 2014-10-27: 1000 mg via INTRAVENOUS
  Filled 2014-10-27: qty 10

## 2014-10-27 MED ORDER — CEFAZOLIN SODIUM-DEXTROSE 2-3 GM-% IV SOLR
2.0000 g | Freq: Once | INTRAVENOUS | Status: AC
  Administered 2014-10-27: 2 g via INTRAVENOUS

## 2014-10-27 MED ORDER — BUPIVACAINE HCL (PF) 0.5 % IJ SOLN
INTRAMUSCULAR | Status: DC | PRN
  Administered 2014-10-27: 3 mL via INTRATHECAL

## 2014-10-27 MED ORDER — INSULIN ASPART 100 UNIT/ML ~~LOC~~ SOLN: 0.0000 [IU] | Freq: Every day | SUBCUTANEOUS | Status: DC

## 2014-10-27 MED ORDER — LISINOPRIL 5 MG PO TABS
2.5000 mg | ORAL_TABLET | Freq: Every day | ORAL | Status: DC
Start: 2014-10-27 — End: 2014-10-30
  Administered 2014-10-28 – 2014-10-30 (×3): 2.5 mg via ORAL
  Filled 2014-10-27 (×3): qty 1

## 2014-10-27 MED ORDER — GLYCOPYRROLATE 0.2 MG/ML IJ SOLN
INTRAMUSCULAR | Status: DC | PRN
  Administered 2014-10-27: 0.2 mg via INTRAVENOUS

## 2014-10-27 MED ORDER — NEOMYCIN-POLYMYXIN B GU 40-200000 IR SOLN
Status: AC
  Filled 2014-10-27: qty 20

## 2014-10-27 MED ORDER — OXYCODONE HCL 5 MG PO TABS
5.0000 mg | ORAL_TABLET | ORAL | Status: DC | PRN
  Administered 2014-10-27 (×4): 5 mg via ORAL
  Administered 2014-10-28: 10 mg via ORAL
  Administered 2014-10-28 (×2): 5 mg via ORAL
  Administered 2014-10-28: 10 mg via ORAL
  Administered 2014-10-28 – 2014-10-29 (×4): 5 mg via ORAL
  Administered 2014-10-29 – 2014-10-30 (×3): 10 mg via ORAL
  Administered 2014-10-30: 5 mg via ORAL
  Filled 2014-10-27: qty 1
  Filled 2014-10-27: qty 2
  Filled 2014-10-27 (×2): qty 1
  Filled 2014-10-27: qty 2
  Filled 2014-10-27 (×2): qty 1
  Filled 2014-10-27: qty 2
  Filled 2014-10-27 (×2): qty 1
  Filled 2014-10-27: qty 2
  Filled 2014-10-27 (×3): qty 1
  Filled 2014-10-27: qty 2
  Filled 2014-10-27: qty 1

## 2014-10-27 MED ORDER — ONDANSETRON HCL 4 MG PO TABS: 4.0000 mg | ORAL_TABLET | Freq: Four times a day (QID) | ORAL | Status: DC | PRN

## 2014-10-27 MED ORDER — ENOXAPARIN SODIUM 30 MG/0.3ML ~~LOC~~ SOLN: 30.0000 mg | Freq: Two times a day (BID) | SUBCUTANEOUS | Status: DC

## 2014-10-27 MED ORDER — ACETAMINOPHEN 650 MG RE SUPP: 650.0000 mg | Freq: Four times a day (QID) | RECTAL | Status: DC | PRN

## 2014-10-27 MED ORDER — SODIUM CHLORIDE 0.9 % IV SOLN
INTRAVENOUS | Status: DC
  Administered 2014-10-27 (×2): via INTRAVENOUS

## 2014-10-27 MED ORDER — SODIUM CHLORIDE 0.9 % IJ SOLN
INTRAMUSCULAR | Status: DC | PRN
  Administered 2014-10-27: 60 mL via INTRAVENOUS

## 2014-10-27 MED ORDER — ALUM & MAG HYDROXIDE-SIMETH 200-200-20 MG/5ML PO SUSP: 30.0000 mL | ORAL | Status: DC | PRN

## 2014-10-27 SURGICAL SUPPLY — 51 items
BANDAGE ELASTIC 6 CLIP ST LF (GAUZE/BANDAGES/DRESSINGS) ×3 IMPLANT
BLADE SAW 1 (BLADE) ×3 IMPLANT
BLOCK CUTTING TIBIAL 2 RT (MISCELLANEOUS) IMPLANT
BLOCK FEMUR CUTTING 2P RIGHT (MISCELLANEOUS) IMPLANT
CANISTER SUCT 1200ML W/VALVE (MISCELLANEOUS) ×3 IMPLANT
CANISTER SUCT 3000ML (MISCELLANEOUS) ×6 IMPLANT
CAPT KNEE TOTAL 3 ×3 IMPLANT
CATH FOL LEG HOLDER (MISCELLANEOUS) ×3 IMPLANT
CATH TRAY 16F METER LATEX (MISCELLANEOUS) ×3 IMPLANT
CEMENT HV SMART SET (Cement) ×6 IMPLANT
CHLORAPREP W/TINT 26ML (MISCELLANEOUS) ×6 IMPLANT
COOLER POLAR GLACIER W/PUMP (MISCELLANEOUS) ×3 IMPLANT
DRAPE INCISE IOBAN 66X45 STRL (DRAPES) ×6 IMPLANT
DRAPE SHEET LG 3/4 BI-LAMINATE (DRAPES) ×6 IMPLANT
ELECT CAUTERY BLADE 6.4 (BLADE) ×3 IMPLANT
GAUZE PETRO XEROFOAM 1X8 (MISCELLANEOUS) ×3 IMPLANT
GAUZE SPONGE 4X4 12PLY STRL (GAUZE/BANDAGES/DRESSINGS) ×3 IMPLANT
GLOVE BIOGEL PI IND STRL 9 (GLOVE) ×1 IMPLANT
GLOVE BIOGEL PI INDICATOR 9 (GLOVE) ×2
GLOVE SURG ORTHO 9.0 STRL STRW (GLOVE) ×3 IMPLANT
GOWN SPECIALTY ULTRA XL (MISCELLANEOUS) ×3 IMPLANT
GOWN STRL REUS W/ TWL LRG LVL3 (GOWN DISPOSABLE) ×2 IMPLANT
GOWN STRL REUS W/TWL LRG LVL3 (GOWN DISPOSABLE) ×4
HANDPIECE SUCTION TUBG SURGILV (MISCELLANEOUS) ×3 IMPLANT
HOOD PEEL AWAY FACE SHEILD DIS (HOOD) ×6 IMPLANT
IMMBOLIZER KNEE 19 BLUE UNIV (SOFTGOODS) ×3 IMPLANT
IV SET EXTENSION 6 LL TADAPT (SET/KITS/TRAYS/PACK) ×3 IMPLANT
KNEE MEDACTA TIBIAL/FEMORAL BL (Knees) ×3 IMPLANT
KNIFE SCULPS 14X20 (INSTRUMENTS) ×3 IMPLANT
NDL SAFETY 18GX1.5 (NEEDLE) ×3 IMPLANT
NEEDLE SPNL 18GX3.5 QUINCKE PK (NEEDLE) ×3 IMPLANT
NEEDLE SPNL 20GX3.5 QUINCKE YW (NEEDLE) ×3 IMPLANT
NS IRRIG 1000ML POUR BTL (IV SOLUTION) ×3 IMPLANT
PACK TOTAL KNEE (MISCELLANEOUS) ×3 IMPLANT
PAD GROUND ADULT SPLIT (MISCELLANEOUS) ×3 IMPLANT
PAD WRAPON POLAR KNEE (MISCELLANEOUS) ×1 IMPLANT
SOL .9 NS 3000ML IRR  AL (IV SOLUTION) ×2
SOL .9 NS 3000ML IRR UROMATIC (IV SOLUTION) ×1 IMPLANT
STAPLER SKIN PROX 35W (STAPLE) ×3 IMPLANT
STEM EXTENSION 11MMX30MM (Stem) ×3 IMPLANT
STRAP SAFETY BODY (MISCELLANEOUS) ×3 IMPLANT
SUCTION FRAZIER TIP 10 FR DISP (SUCTIONS) ×3 IMPLANT
SUT DVC 2 QUILL PDO  T11 36X36 (SUTURE) ×2
SUT DVC 2 QUILL PDO T11 36X36 (SUTURE) ×1 IMPLANT
SUT DVC QUILL MONODERM 30X30 (SUTURE) ×3 IMPLANT
SUT ETHIBOND NAB CT1 #1 30IN (SUTURE) ×3 IMPLANT
SYR 20CC LL (SYRINGE) ×3 IMPLANT
SYR 50ML LL SCALE MARK (SYRINGE) ×3 IMPLANT
TOWER CARTRIDGE SMART MIX (DISPOSABLE) ×3 IMPLANT
WATER STERILE IRR 1000ML POUR (IV SOLUTION) ×3 IMPLANT
WRAPON POLAR PAD KNEE (MISCELLANEOUS) ×3

## 2014-10-27 NOTE — Anesthesia Preprocedure Evaluation (Signed)
Anesthesia Evaluation   Patient awake    Reviewed: Allergy & Precautions, NPO status , Patient's Chart, lab work & pertinent test results  History of Anesthesia Complications Negative for: history of anesthetic complications  Airway Mallampati: II       Dental no notable dental hx.    Pulmonary neg pulmonary ROS, former smoker,    Pulmonary exam normal + decreased breath sounds      Cardiovascular hypertension, Pt. on medications Normal cardiovascular exam    Neuro/Psych negative neurological ROS     GI/Hepatic Neg liver ROS, GERD-  ,  Endo/Other  diabetes, Type 2, Oral Hypoglycemic Agents  Renal/GU negative Renal ROS     Musculoskeletal negative musculoskeletal ROS (+)   Abdominal (+) + obese,   Peds negative pediatric ROS (+)  Hematology negative hematology ROS (+)   Anesthesia Other Findings   Reproductive/Obstetrics negative OB ROS                             Anesthesia Physical Anesthesia Plan  ASA: III  Anesthesia Plan: Spinal   Post-op Pain Management:    Induction: Intravenous  Airway Management Planned: Nasal Cannula  Additional Equipment:   Intra-op Plan:   Post-operative Plan:   Informed Consent: I have reviewed the patients History and Physical, chart, labs and discussed the procedure including the risks, benefits and alternatives for the proposed anesthesia with the patient or authorized representative who has indicated his/her understanding and acceptance.     Plan Discussed with: CRNA  Anesthesia Plan Comments:         Anesthesia Quick Evaluation

## 2014-10-27 NOTE — Op Note (Signed)
10/27/2014  11:05 AM  PATIENT:  Rachael Strickland  44 y.o. female  PRE-OPERATIVE DIAGNOSIS:  Post Traumatic osteoarthritis of one knee  POST-OPERATIVE DIAGNOSIS:  Post Traumatic osteoarthritis of one knee  PROCEDURE:  Procedure(s): TOTAL KNEE ARTHROPLASTY (Right)  SURGEON: Leitha SchullerMichael J Shawn Dannenberg, MD  ASSISTANTS: [facility]  ANESTHESIA:   spinal  EBL:  Total I/O In: 1800 [I.V.:1800] Out: 750 [Urine:700; Blood:50]  BLOOD ADMINISTERED:none  DRAINS: none   LOCAL MEDICATIONS USED:  MARCAINE    and OTHER  Exparel Toradol and morphine   SPEICIMEN:  Source of Specimen:  Cut ends of bone  DISPOSITION OF SPECIMEN:  PATHOLOGY  COUNTS:  YES  TOURNIQUET:   102 minutes at 300 mmHg   IMPLANTS:Medacta GMK sphere 2+ right femoral component, 2 tibial component 10 mm insert and 29 mm patella all components cemented   DICTATION: .Dragon Dictation patient brought the operating room and after adequate anesthesia was obtained the right leg was prepped and draped in sterile fashion was turned by the upper thigh. After patient education timeout procedures were completed, tourniquet was raised to 300 mmHg and a midline skin incision was made using the prior skin incision. Inspection knee revealed severe patellofemoral degenerative change with extensive synovitis through the knee apparently related to prior multiple prior surgeries. There is mild to moderate femoral and tibial wear. Anterior cruciate ligament and PCL were incised and the infrapatellar fat pad excised. Approximately tibia cutting guide was applied and the proximal tibia cut carried out using the Medacta my knee system. Going to the femur identical procedure carried out. The posterior horns of menisci removed and the 4-in-1 cutting guide applied for the 2+ femur anterior posterior and chamfer cuts made with no notching. The tibial trial was placed tendon disposition and proximal reaming carried out for short stem and keel punch placed followed  by placement of the 2+ femur with 10 mm insert gave excellent stability and full extension. Distal femoral drill holes were made and a notch cut made using a router for the trochlear groove. All the trials were then removed and the patella prepared using a inset were getting down to a prior hardware from prior patellar tendon surgery. Sized to a size 29 trial fit well. At this point the knee was infiltrated with half percent Sensorcaine with epinephrine Toradol and morphine IN a dilute solution as well as Exparel IN a dilute solution. The bony surfaces were then thoroughly irrigated and dried and all components cemented in place. After the cement set the knee was irrigated with Betadine dilute Betadine solution excess cement removed and the patella tracked well with no touch technique good stability through range of motion. The arthrotomy was then repaired using a heavy Quill with to a Quill subcutaneously and skin staples applied. Xeroform 4 x 4's ABDs and web roll Polar Care and Ace wrap applied and patient sent to recovery room  PLAN OF CARE: Admit to inpatient   PATIENT DISPOSITION:  PACU - hemodynamically stable.

## 2014-10-27 NOTE — Anesthesia Procedure Notes (Signed)
Spinal Patient location during procedure: OR Start time: 10/27/2014 8:30 AM End time: 10/27/2014 8:42 AM Staffing Anesthesiologist: Marline Backbone F Resident/CRNA: Rolla Plate Performed by: resident/CRNA  Preanesthetic Checklist Completed: patient identified, site marked, surgical consent, pre-op evaluation, timeout performed, IV checked, risks and benefits discussed and monitors and equipment checked Spinal Block Patient position: sitting Prep: Betadine and site prepped and draped Patient monitoring: heart rate, continuous pulse ox, blood pressure and cardiac monitor Approach: midline Location: L4-5 Injection technique: single-shot Needle Needle type: Whitacre and Introducer  Needle gauge: 25 G Needle length: 12.7 cm Additional Notes Negative paresthesia. Negative blood return. Positive free-flowing CSF. Expiration date of kit checked and confirmed. Patient tolerated procedure well, without complications.

## 2014-10-27 NOTE — Anesthesia Postprocedure Evaluation (Signed)
  Anesthesia Post-op Note  Patient: Rachael MarkerAnissia Strickland  Procedure(s) Performed: Procedure(s): TOTAL KNEE ARTHROPLASTY (Right)  Anesthesia type:Spinal  Patient location: PACU  Post pain: Pain level controlled  Post assessment: Post-op Vital signs reviewed, Patient's Cardiovascular Status Stable, Respiratory Function Stable, Patent Airway and No signs of Nausea or vomiting  Post vital signs: Reviewed and stable  Last Vitals:  Filed Vitals:   10/27/14 1144  BP:   Pulse:   Temp: 36.1 C  Resp:     Level of consciousness: awake, alert  and patient cooperative  Complications: No apparent anesthesia complications

## 2014-10-27 NOTE — Transfer of Care (Signed)
Immediate Anesthesia Transfer of Care Note  Patient: Rachael Strickland  Procedure(s) Performed: Procedure(s): TOTAL KNEE ARTHROPLASTY (Right)  Patient Location: PACU  Anesthesia Type:Spinal  Level of Consciousness: awake  Airway & Oxygen Therapy: Patient Spontanous Breathing and Patient connected to face mask oxygen  Post-op Assessment: Report given to RN  Post vital signs: Reviewed  Last Vitals:  Filed Vitals:   10/27/14 1105  BP: 107/59  Pulse: 84  Temp: 36.4 C  Resp: 16    Complications: No apparent anesthesia complications

## 2014-10-27 NOTE — H&P (Signed)
Reviewed paper H+P, will be scanned into chart. No changes noted.  

## 2014-10-27 NOTE — Anesthesia Postprocedure Evaluation (Signed)
  Anesthesia Post-op Note  Patient: Rachael Strickland  Procedure(s) Performed: Procedure(s): TOTAL KNEE ARTHROPLASTY (Right)  Anesthesia type:No value filed.  Patient location: PACU  Post pain: Pain level controlled  Post assessment: Post-op Vital signs reviewed, Patient's Cardiovascular Status Stable, Respiratory Function Stable, Patent Airway and No signs of Nausea or vomiting  Post vital signs: Reviewed and stable  Last Vitals:  Filed Vitals:   10/27/14 1144  BP:   Pulse:   Temp: 36.1 C  Resp:     Level of consciousness: awake, alert  and patient cooperative  Complications: No apparent anesthesia complications

## 2014-10-28 LAB — BASIC METABOLIC PANEL
ANION GAP: 7 (ref 5–15)
BUN: 9 mg/dL (ref 6–20)
CALCIUM: 8.7 mg/dL — AB (ref 8.9–10.3)
CO2: 23 mmol/L (ref 22–32)
CREATININE: 1.19 mg/dL — AB (ref 0.44–1.00)
Chloride: 108 mmol/L (ref 101–111)
GFR calc Af Amer: >60 mL/min (ref >60)
GFR calc non Af Amer: 55 mL/min — ABNORMAL LOW (ref >60)
GLUCOSE: 117 mg/dL — AB (ref 65–99)
POTASSIUM: 4.1 mmol/L (ref 3.5–5.1)
Sodium: 138 mmol/L (ref 135–145)

## 2014-10-28 LAB — GLUCOSE, CAPILLARY
GLUCOSE-CAPILLARY: 114 mg/dL — AB (ref 65–99)
GLUCOSE-CAPILLARY: 114 mg/dL — AB (ref 65–99)
GLUCOSE-CAPILLARY: 98 mg/dL (ref 65–99)
Glucose-Capillary: 89 mg/dL (ref 65–99)
Glucose-Capillary: 114 mg/dL — ABNORMAL HIGH (ref 65–99)
Glucose-Capillary: 89 mg/dL (ref 65–99)

## 2014-10-28 LAB — CBC
HEMATOCRIT: 34.1 % — AB (ref 35.0–47.0)
Hemoglobin: 11 g/dL — ABNORMAL LOW (ref 12.0–16.0)
MCH: 26.8 pg (ref 26.0–34.0)
MCHC: 32.2 g/dL (ref 32.0–36.0)
MCV: 83.3 fL (ref 80.0–100.0)
Platelets: 276 10*3/uL (ref 150–440)
RBC: 4.1 MIL/uL (ref 3.80–5.20)
RDW: 14.4 % (ref 11.5–14.5)
WBC: 8.6 10*3/uL (ref 3.6–11.0)

## 2014-10-28 NOTE — Evaluation (Signed)
Physical Therapy Evaluation Patient Details Name: Rachael Strickland MRN: [id number] DOB: 06/28/1970 Today's Date: 10/28/2014   History of Present Illness  Pt underwent R TKR with acute blood loss anemia. She is POD#1 at time of evaluation. No falls in the last 12 months.   Clinical Impression  Pt is very limited by pain during AM session. Coordinated with RN prior to arrival to ensure pt had been premedicated. During initial session pt performs limited bed level exercises with therapist. During first repetition of SAQ therapist noted that pt is bleeding from the top of her dressing so charge nurse notified who arrives to redress R knee. Upon return to patient 30 minutes later pt refuses any further therapy until she has more pain medication. Coordinated with RN and charge who offer to administer IV morphine. Coordinated with RN to return after 15-20 minutes to re-attempt therapy. Upon return to room pt is beginning a bed bath. Asked if the CNA could hold bath until after therapy so that therapy can be performed with pain well controlled. CNA is upset and leaves the room. Pt is able to complete bed level exercises but demonstrates very poor R knee flexion and actively resists bending R knee. She does better than expected with ambulation and is able to make it to the door and back to the recliner. Knee immobilizer utilized for all mobility. Pt might benefit from CPM machine to aid in R knee flexion as she is having considerable difficulty bending RLE. Pt will benefit from skilled PT services to address deficits in strength, balance, and mobility in order to return to full function at home.      Follow Up Recommendations Home health PT    Equipment Recommendations  Rolling walker with 5" wheels    Recommendations for Other Services       Precautions / Restrictions Precautions Precautions: Fall Required Braces or Orthoses: Knee Immobilizer - Right Knee Immobilizer - Right: Other (comment) (If unable  to perform SLR) Restrictions Weight Bearing Restrictions: Yes      Mobility  Bed Mobility   Bed Mobility: Supine to Sit     Supine to sit: Mod assist     General bed mobility comments: Pt requires considerable cues and assistance and is primarily limited due to pain. HOB elevated and bed rail required  Transfers Overall transfer level: Needs assistance Equipment used: Rolling walker (2 wheeled) Transfers: Sit to/from Stand Sit to Stand: Min guard         General transfer comment: Good sequencing and strength noted. Cues for safe hand placement and sequencing with transfer. KI donned for all bed mobility, transfers, and ambulation  Ambulation/Gait Ambulation/Gait assistance: Min guard Ambulation Distance (Feet): 20 Feet Assistive device: Rolling walker (2 wheeled) Gait Pattern/deviations: Step-to pattern;Decreased step length - left;Decreased stance time - right;Antalgic   Gait velocity interpretation: <1.8 ft/sec, indicative of risk for recurrent falls General Gait Details: Pt requires heavy cues and instruction for safe hand placement and sequencing with walker. Decreased gait speed. Assist required for turns and to back up to recliner.   Stairs            Wheelchair Mobility    Modified Rankin (Stroke Patients Only)       Balance Overall balance assessment: Needs assistance   Sitting balance-Leahy Scale: Good       Standing balance-Leahy Scale: Poor  Pertinent Vitals/Pain Pain Assessment: 0-10 Pain Score: 10-Worst pain ever Pain Location: R knee Pain Intervention(s): Limited activity within patient's tolerance;Monitored during session;Premedicated before session    Home Living Family/patient expects to be discharged to:: Private residence Living Arrangements: Alone Available Help at Discharge: Friend(s) Type of Home: Apartment Home Access: Stairs to enter Entrance Stairs-Rails: Left Entrance  Stairs-Number of Steps: 17 Home Layout: One level Home Equipment: None Additional Comments: No shower grabs, no grab bars in shower, no BSC    Prior Function Level of Independence: Independent         Comments: Doesn't own a car     Hand Dominance   Dominant Hand: Right    Extremity/Trunk Assessment   Upper Extremity Assessment: Overall WFL for tasks assessed           Lower Extremity Assessment: RLE deficits/detail RLE Deficits / Details: Unable to perform partial SLR or SAQ without assist. 5/5 R knee DF. Unable to perform full MMT       Communication   Communication: No difficulties  Cognition Arousal/Alertness: Awake/alert Behavior During Therapy: WFL for tasks assessed/performed Overall Cognitive Status: Within Functional Limits for tasks assessed                      General Comments      Exercises Total Joint Exercises Ankle Circles/Pumps: Strengthening;Both;10 reps;Supine Quad Sets: Strengthening;Both;10 reps;Supine Gluteal Sets: Strengthening;Both;10 reps;Supine Towel Squeeze: Strengthening;Both;10 reps;Supine Short Arc Quad: Strengthening;Both;10 reps;Supine Hip ABduction/ADduction: Strengthening;Both;10 reps;Supine Straight Leg Raises: Strengthening;Both;10 reps;Supine Goniometric ROM: -6 to 50 degrees AROM with overpressure, pain limited      Assessment/Plan    PT Assessment Patient needs continued PT services  PT Diagnosis Difficulty walking;Abnormality of gait;Generalized weakness;Acute pain   PT Problem List Decreased strength;Decreased range of motion;Decreased activity tolerance;Decreased balance;Decreased mobility;Decreased knowledge of use of DME;Decreased safety awareness;Pain  PT Treatment Interventions DME instruction;Gait training;Stair training;Functional mobility training;Therapeutic activities;Therapeutic exercise;Balance training;Neuromuscular re-education;Patient/family education;Manual techniques   PT Goals (Current  goals can be found in the Care Plan section) Acute Rehab PT Goals Patient Stated Goal: "I want for my leg to feel better." Pt is in a lot of pain and requires 3 attempts to finish evaluation PT Goal Formulation: With patient Time For Goal Achievement: 11/11/14 Potential to Achieve Goals: Good    Frequency BID   Barriers to discharge Inaccessible home environment 17 steps to enter apartment    Co-evaluation               End of Session Equipment Utilized During Treatment: Gait belt Activity Tolerance: Patient limited by pain (3 attempts required due to pain) Patient left: in chair;with call bell/phone within reach;with chair alarm set;with SCD's reapplied;Other (comment) (towel roll under heel, polar care in place) Nurse Communication: Mobility status         Time: [id number] PT Time Calculation (min) (ACUTE ONLY): 35 min   Charges:   PT Evaluation $Initial PT Evaluation Tier I: 1 Procedure PT Treatments $Therapeutic Exercise: 8-22 mins  Session was performed in 2 bouts from 09:10-09:25 and then again from 1020-1050   PT G Codes:       Sharalyn InkJason D Shaniqwa Horsman PT, DPT   Vue Pavon 10/28/2014, 11:18 AM

## 2014-10-28 NOTE — Care Management Note (Signed)
Case Management Note  Patient Details  Name: Rachael Strickland MRN: [id number] Date of Birth: [birth year]  Subjective/Objective:                  Met with patient and her friend/neighbor Mr. Rachael Strickland to discuss discharge planning after patient consented to discussion. She lives alone; her boyfriend is incarcerated.She wants to go to SNF at discharge. She has no DME. She uses [facility] [address] for Rx. PT recommending HHPT and possibly CPM.   Action/Plan: List of home health agencies provided to patient. RNCM will continue to follow. CSW updated on patient request. Patient came to [facility] joint class prior to elective surgery. She appeared to become agitated in the meeting with many other patient's pending surgery. She blurted out commands during the meeting that she "will have to go to nursing home because she has 17 steps to get into the home". This RNCM attempted to calm patient down to continue the class and then after the class she again became agitated that I would not (could not) sign two forms from DSS requesting "provider signature for transportation" she states needed between two classes that were at [facility] on that day. I explained that I was not authorized to sign DSS forms and to speak with provider. She has not presented forms this visit. CSW updated.   Expected Discharge Date:                  Expected Discharge Plan:     In-House Referral:  Clinical Social Work  Discharge planning Services  CM Consult  Post Acute Care Choice:    Choice offered to:  Patient (Neighbor/friend Mr. Rachael Strickland at bedside.)  DME Arranged:    DME Agency:     HH Arranged:    [place] Agency:     Status of Service:  In process, will continue to follow  Medicare Important Message Given:    Date Medicare IM Given:    Medicare IM give by:    Date Additional Medicare IM Given:    Additional Medicare Important Message give by:     If discussed at [place] of Stay Meetings, dates discussed:    Additional  Comments:  Bird Tailor, RN 10/28/2014, 10:49 AM

## 2014-10-28 NOTE — Progress Notes (Signed)
   Subjective: 1 Day Post-Op Procedure(s) (LRB): TOTAL KNEE ARTHROPLASTY (Right) Patient reports pain as mild.   Patient is well, and has had no acute complaints or problems We will start therapy today.  Plan is to go Home after hospital stay.  Objective: Vital signs in last 24 hours: Temp:  [97 F (36.1 C)-100 F (37.8 C)] 100 F (37.8 C) (07/20 0723) Pulse Rate:  [53-95] 80 (07/20 0723) Resp:  [12-20] 16 (07/20 0723) BP: (98-134)/(59-83) 119/63 mmHg (07/20 0723) SpO2:  [98 %-100 %] 100 % (07/20 0723) FiO2 (%):  [21 %] 21 % (07/19 1208) Weight:  [108.41 kg (239 lb)] 108.41 kg (239 lb) (07/19 1213)  Intake/Output from previous day: 07/19 0701 - 07/20 0700 In: 2635 [P.O.:410; I.V.:2225] Out: 5550 [Urine:5500; Blood:50] Intake/Output this shift:     Recent Labs  10/27/14 0834 10/27/14 1317 10/28/14 0614  HGB 12.69 12.0 11.0*    Recent Labs  10/27/14 1317 10/28/14 0614  WBC 7.1 8.6  RBC 4.52 4.10  HCT 37.9 34.1*  PLT 294 276    Recent Labs  10/27/14 0834 10/27/14 1317 10/28/14 0614  NA 139  --  138  K 6.2  --  4.1  CL  --   --  108  CO2  --   --  23  BUN  --   --  9  CREATININE  --  1.14* 1.19*  GLUCOSE  --   --  117*  CALCIUM  --   --  8.7*   No results for input(s): LABPT, INR in the last 72 hours.  EXAM General - Patient is Alert, Appropriate and Oriented Extremity - Neurovascular intact Sensation intact distally Intact pulses distally Dorsiflexion/Plantar flexion intact Dressing - dressing C/D/I and no drainage Motor Function - intact, moving foot and toes well on exam.   Past Medical History  Diagnosis Date  . Hypertension   . Elevated lipids   . Diabetes mellitus without complication   . GERD (gastroesophageal reflux disease)     Assessment/Plan:   1 Day Post-Op Procedure(s) (LRB): TOTAL KNEE ARTHROPLASTY (Right) Active Problems:   Traumatic arthritis of knee   Acute post op blood loss anemia     Estimated body mass index is  41 kg/(m^2) as calculated from the following:   Height as of this encounter: 5\' 4"  (1.626 m).   Weight as of this encounter: 108.41 kg (239 lb). Advance diet Up with therapy  Recheck labs in the am Needs BM   DVT Prophylaxis - Lovenox, Foot Pumps and TED hose Weight-Bearing as tolerated to right leg D/C O2 and Pulse OX and try on Room Air  T. [place] Neighborhris Gaines, PA-C [facility] 10/28/2014, 7:49 AM

## 2014-10-28 NOTE — Clinical Social Work Placement (Signed)
   CLINICAL SOCIAL WORK PLACEMENT  NOTE  Date:  10/28/2014  Patient Details  Name: Rachael Strickland MRN: [id number] Date of Birth: [birth year]  Clinical Social Work is seeking post-discharge placement for this patient at the Skilled  Nursing Facility level of care (*CSW will initial, date and re-position this form in  chart as items are completed):  Yes   Patient/family provided with [facility] Clinical Social Work Department's list of facilities offering this level of care within the geographic area requested by the patient (or if unable, by the patient's family).  Yes   Patient/family informed of their freedom to choose among providers that offer the needed level of care, that participate in Medicare, Medicaid or managed care program needed by the patient, have an available bed and are willing to accept the patient.  Yes   Patient/family informed of [facility]'s ownership interest [facility] and [facility], as well as of the fact that they are under no obligation to receive care at these facilities.  PASRR submitted to EDS on 10/28/14     PASRR number received on 10/28/14     Existing PASRR number confirmed on       FL2 transmitted to all facilities in geographic area requested by pt/family on 10/28/14     FL2 transmitted to all facilities within larger geographic area on       Patient informed that his/her managed care company has contracts with or will negotiate with certain facilities, including the following:            Patient/family informed of bed offers received.  Patient chooses bed at       Physician recommends and patient chooses bed at      Patient to be transferred to   on  .  Patient to be transferred to facility by       Patient family notified on   of transfer.  Name of family member notified:        PHYSICIAN Please sign FL2     Additional Comment:    _______________________________________________ Haig ProphetMorgan, [name], LCSW 10/28/2014,  4:45 PM

## 2014-10-28 NOTE — Progress Notes (Signed)
Physical Therapy Treatment Patient Details Name: Rachael Strickland MRN: [id number] DOB: Jan 01, 1971 Today's Date: 10/28/2014    History of Present Illness This patient is a 44 year old female who came to [facility] for a R TKR    PT Comments    Pt demonstrates slow improvement but not as fast as would be hopeful to be able to return home at discharge with HHPT. Discharge plan needs to be updated to SNF. Unlikely pt will be able to ascend/descend 17 steps to enter/exit home at discharge. In addition pt with assist required for bed mobilty and limited assistance from friends at discharge. Pain is better controlled this afternoon and ambulation distance progressed. Pt will benefit from skilled PT services to address deficits in strength, balance, and mobility in order to return to full function at home.    Follow Up Recommendations  SNF (Limited progress, 17 stairs to enter, limited support)     Equipment Recommendations  Rolling walker with 5" wheels    Recommendations for Other Services       Precautions / Restrictions Precautions Precautions: Fall;Knee Required Braces or Orthoses: Knee Immobilizer - Right Knee Immobilizer - Right: Other (comment) (If unable to perform SLR) Restrictions Weight Bearing Restrictions: Yes    Mobility  Bed Mobility Overal bed mobility: Needs Assistance Bed Mobility: Supine to Sit;Sit to Supine     Supine to sit: Mod assist Sit to supine: Mod assist;HOB elevated   General bed mobility comments: Pt requires considerable cues and assistance and is primarily limited due to pain. HOB elevated and bed rail required  Transfers Overall transfer level: Needs assistance Equipment used: Rolling walker (2 wheeled) Transfers: Sit to/from Stand Sit to Stand: Min guard         General transfer comment: Improving sequencing and strength. Pt requires cues for proper hand placement and safety. Decreased pain from AM session  Ambulation/Gait Ambulation/Gait  assistance: Min guard Ambulation Distance (Feet): 80 Feet Assistive device: Rolling walker (2 wheeled) Gait Pattern/deviations: Step-to pattern   Gait velocity interpretation: <1.8 ft/sec, indicative of risk for recurrent falls General Gait Details: Pt requires instruction again for proper sequencing. Cues required to allow R knee flexion during swing and improve upright posture. Pain monitored throughout.    Stairs            Wheelchair Mobility    Modified Rankin (Stroke Patients Only)       Balance     Sitting balance-Leahy Scale: Good       Standing balance-Leahy Scale: Poor                      Cognition Arousal/Alertness: Awake/alert Behavior During Therapy: WFL for tasks assessed/performed Overall Cognitive Status: Within Functional Limits for tasks assessed                      Exercises Total Joint Exercises Ankle Circles/Pumps: Strengthening;Both;10 reps;Supine Quad Sets: Strengthening;Both;10 reps;Supine Gluteal Sets: Strengthening;Both;10 reps;Supine Towel Squeeze: Strengthening;Both;10 reps;Supine Hip ABduction/ADduction: Strengthening;Both;10 reps;Supine Straight Leg Raises: Strengthening;Both;10 reps;Supine Long Arc Quad: Strengthening;10 reps;Seated;Right    General Comments        Pertinent Vitals/Pain Pain Assessment: No/denies pain (Denies at rest. Does not rate with movement)    Home Living                      Prior Function            PT Goals (current goals can now be found  in the care plan section) Acute Rehab PT Goals Patient Stated Goal: To get better PT Goal Formulation: With patient Time For Goal Achievement: 11/11/14 Potential to Achieve Goals: Good Progress towards PT goals: Progressing toward goals    Frequency  BID    PT Plan Discharge plan needs to be updated    Co-evaluation             End of Session Equipment Utilized During Treatment: Gait belt Activity Tolerance: Patient  limited by pain (3 attempts required due to pain) Patient left: with SCD's reapplied;Other (comment);in bed;with bed alarm set;with call bell/phone within reach (towel roll under heel, polar care in place)     Time: 1337-1410 PT Time Calculation (min) (ACUTE ONLY): 33 min  Charges:  $Gait Training: 8-22 mins $Therapeutic Exercise: 8-22 mins                    G Codes:      Rachael Strickland PT, DPT   Rachael Strickland 10/28/2014, 5:03 PM

## 2014-10-28 NOTE — Anesthesia Postprocedure Evaluation (Signed)
  Anesthesia Post-op Note  Patient: Rachael Strickland  Procedure(s) Performed: Procedure(s): TOTAL KNEE ARTHROPLASTY (Right)  Anesthesia type:Spinal  Patient location: PACU  Post pain: Pain level controlled  Post assessment: Post-op Vital signs reviewed, Patient's Cardiovascular Status Stable, Respiratory Function Stable, Patent Airway and No signs of Nausea or vomiting  Post vital signs: Reviewed and stable  Last Vitals:  Filed Vitals:   10/28/14 0723  BP: 119/63  Pulse: 80  Temp: 37.8 C  Resp: 16    Level of consciousness: awake, alert  and patient cooperative  Complications: No apparent anesthesia complications

## 2014-10-28 NOTE — Clinical Social Work Note (Signed)
Clinical Social Work Assessment  Patient Details  Name: Rachael Strickland MRN: [id number] Date of Birth: 05-05-1970  Date of referral:  10/28/14               Reason for consult:  Facility Placement                Permission sought to share information with:  Chartered certified accountant granted to share information::  Yes, Verbal Permission Granted  Name::      Rachael Strickland::   Rachael Strickland   Relationship::     Contact Information:     Housing/Transportation Living arrangements for the past 2 months:  [place]achael Strickland of Information:  Patient Patient Interpreter Needed:  None Criminal Activity/Legal Involvement Pertinent to Current Situation/Hospitalization:  No - Comment as needed (Patient's boyfriend is in jail. ) Significant Relationships:  Significant Other Lives with:  Self Do you feel safe going back to the place where you live?  Yes Need for family participation in patient care:  No (Coment)  Care giving concerns:  Patient lives alone in [place]    Social Worker assessment / plan:  PT changed recommendation from home health to SNF this afternoon. Clinical Social Worker (CSW) met with patient to address SNF consult. CSW introduced self and explained role of CSW department. Patient reported that she lives with her boyfriend in [place] however he is currently in jail. Patient reported that she is alone at home. CSW discussed SNF placement. Patient is agreeable to SNF search and does not have a preference of a facility. SNF list was provided.   FL2 complete and faxed out.   Employment status:  Disabled (Comment on whether or not currently receiving Disability) Insurance information:  Medicare, Medicaid [place] PT Recommendations:  [place]achael Strickland / Referral to community resources:  [place]achael Strickland  Patient/Family's Response to care: Patient is agreeable to [place] in [place].    Patient/Family's Understanding of and Emotional Response to Diagnosis, Current Treatment, and Prognosis:  Patient was sitting up in the bed and alert and oriented. Patient was pleasant throughout assessment.   Emotional Assessment Appearance:  Appears stated age Attitude/Demeanor/Rapport:    Affect (typically observed):  Pleasant Orientation:  Oriented to Self, Oriented to Place, Oriented to  Time, Oriented to Situation Alcohol / Substance use:  Not Applicable Psych involvement (Current and /or in the community):  No (Comment)  Discharge Needs  Concerns to be addressed:  Discharge Planning Concerns Readmission within the last 30 days:  No Current discharge risk:  Lives alone Barriers to Discharge:  Continued Medical Work up   Rachael Strickland 10/28/2014, 4:46 PM

## 2014-10-28 NOTE — Addendum Note (Signed)
Addendum  created 10/28/14 0726 by Tonia Ghentindy Cook-Martin   Modules edited: Notes Section   Notes Section:  File: [id number]

## 2014-10-28 NOTE — Progress Notes (Signed)
Clinical Social Worker (CSW) received SNF consult. PT is recommending home health. RN Case Manager aware of above. Please reconsult if future social work needs arise. CSW signing off.   Kaidin Boehle Morgan, LCSWA [phone] 

## 2014-10-28 NOTE — Evaluation (Signed)
Occupational Therapy Evaluation Patient Details Name: Rachael Strickland MRN: [id number] DOB: 1970-06-22 Today's Date: 10/28/2014    History of Present Illness This patient is a 44 year old female who came to [facility] for a R TKR   Clinical Impression   This patient is a 44 year old female who came to [facility] for a R total knee replacement.  Patient lives in a one story appartment with 17 steps to enter.  She had been independent with ADL and functional mobility. She now requires  assistance for lower body dressing.  She would benefit from Occupational Therapy for ADL/functioal mobility training.      Follow Up Recommendations       Equipment Recommendations       Recommendations for Other Services       Precautions / Restrictions Precautions Precautions: Fall;Knee Required Braces or Orthoses: Knee Immobilizer - Right Knee Immobilizer - Right: Other (comment) (If unable to perform SLR) Restrictions Weight Bearing Restrictions: Yes      Mobility Bed Mobility    Transfers       General transfer comment: Good sequencing and strength noted. Cues for safe hand placement and sequencing with transfer. KI donned for all bed mobility, transfers, and ambulation    Balance                                      ADL                                         General ADL Comments: Patient had been independent with basic ADL and now needs assist. Practiced Donned/doffed socks and pants to knees with hand over hand assist.     Vision     Perception     Praxis      Pertinent Vitals/Pain      Hand Dominance Right   Extremity/Trunk Assessment Upper Extremity Assessment Upper Extremity Assessment: Overall WFL for tasks assessed         Communication Communication Communication: No difficulties   Cognition Arousal/Alertness: Awake/alert Behavior During Therapy: WFL for tasks assessed/performed Overall Cognitive  Status: Within Functional Limits for tasks assessed                     General Comments       Exercises       Shoulder Instructions      Home Living Family/patient expects to be discharged to:: Private residence Living Arrangements: Alone Available Help at Discharge: Friend(s) Type of Home: Apartment Home Access: Stairs to enter [employer] of Steps: 17 Entrance Stairs-Rails: Left Home Layout: One level     Bathroom Shower/Tub: Chief Strategy Officer: None   Additional Comments: No shower grabs, no grab bars in shower, no BSC      Prior Functioning/Environment Level of Independence: Independent            OT Diagnosis: Acute pain   OT Problem List: Decreased activity tolerance   OT Treatment/Interventions: Self-care/ADL training    OT Goals(Current goals can be found in the care plan section) Acute Rehab OT Goals Patient Stated Goal: To get better OT Goal Formulation: With patient Time For Goal Achievement: 11/11/14 Potential to Achieve Goals: Good  OT Frequency: Min 1X/week  Barriers to D/C:            Co-evaluation              End of Session Equipment Utilized During Treatment:  (Hip kit)  Activity Tolerance:   Patient left: in chair;with call bell/phone within reach;with chair alarm set   Time: 1039-1101 OT Time Calculation (min): 22 min Charges:  OT General Charges $OT Visit: 1 Procedure OT Evaluation $Initial OT Evaluation Tier I: 1 Procedure OT Treatments $Self Care/Home Management : 8-22 mins G-Codes:    Myrene Galas, MS/OTR/L  10/28/2014, 11:50 AM

## 2014-10-28 NOTE — Progress Notes (Signed)
Notified Dr. Rosita KeaMenz of saturated surgical dressing. Orders received to remove surgical dressing and replace with honeycomb dressing, abd pads and wrap with an ace bandage.

## 2014-10-29 LAB — CBC
HCT: 37.6 % (ref 35.0–47.0)
HEMOGLOBIN: 12.1 g/dL (ref 12.0–16.0)
MCH: 26.5 pg (ref 26.0–34.0)
MCHC: 32.3 g/dL (ref 32.0–36.0)
MCV: 82.3 fL (ref 80.0–100.0)
PLATELETS: 301 10*3/uL (ref 150–440)
RBC: 4.57 MIL/uL (ref 3.80–5.20)
RDW: 14 % (ref 11.5–14.5)
WBC: 11 10*3/uL (ref 3.6–11.0)

## 2014-10-29 LAB — GLUCOSE, CAPILLARY
GLUCOSE-CAPILLARY: 115 mg/dL — AB (ref 65–99)
Glucose-Capillary: 94 mg/dL (ref 65–99)
Glucose-Capillary: 111 mg/dL — ABNORMAL HIGH (ref 65–99)
Glucose-Capillary: 86 mg/dL (ref 65–99)

## 2014-10-29 LAB — SURGICAL PATHOLOGY

## 2014-10-29 MED ORDER — ENOXAPARIN SODIUM 40 MG/0.4ML ~~LOC~~ SOLN: 40.0000 mg | Freq: Two times a day (BID) | SUBCUTANEOUS | Status: AC

## 2014-10-29 MED ORDER — OXYCODONE HCL 5 MG PO TABS: 5.0000 mg | ORAL_TABLET | ORAL | Status: AC | PRN

## 2014-10-29 NOTE — Progress Notes (Signed)
Patient up and ambulating in hallway with physical therapy

## 2014-10-29 NOTE — Progress Notes (Signed)
Physical Therapy Treatment Patient Details Name: Rachael Strickland MRN: [id number] DOB: Nov 28, 1970 Today's Date: 10/29/2014    History of Present Illness This patient is a 44 year old female who came to [facility] for a R TKR    PT Comments    Pt demonstrates slow but notable progress with therapy on this date. Gait is very slow and guarded and pt is very resistant to R knee flexion during seated exercises. Pt will need SNF placement in order to return to prior level of function. Pt will benefit from skilled PT services to address deficits in strength, balance, and mobility in order to return to full function at home.    Follow Up Recommendations  SNF (Limited progress, 17 stairs to enter, limited support)     Equipment Recommendations  Rolling walker with 5" wheels    Recommendations for Other Services       Precautions / Restrictions Precautions Precautions: Fall;Knee Required Braces or Orthoses: Knee Immobilizer - Right Knee Immobilizer - Right: Other (comment) (If unable to perform SLR) Restrictions Weight Bearing Restrictions: Yes    Mobility  Bed Mobility Overal bed mobility: Needs Assistance Bed Mobility: Supine to Sit     Supine to sit: Min assist     General bed mobility comments: Pt demonstrates good sequencing with bed mobility. She is able to utilize LLE to assist RLE during transfer as she still lacks RLE adduction strength to perform without assist  Transfers Overall transfer level: Needs assistance Equipment used: Rolling walker (2 wheeled) Transfers: Sit to/from Stand Sit to Stand: Min assist         General transfer comment: Decreased LE strength and increased RLE pain noted today during transfer. Cues for sequencing. Pt requires bilateral UE to push up from walker with therapist stabilizing walker in order to come to upright standing. Once upright pt is able to accept more weight on RLE  Ambulation/Gait Ambulation/Gait assistance: Min guard Ambulation  Distance (Feet): 80 Feet Assistive device: Rolling walker (2 wheeled) Gait Pattern/deviations: Step-to pattern   Gait velocity interpretation: <1.8 ft/sec, indicative of risk for recurrent falls General Gait Details: Cues for upright posture and increased R knee flexion during swing. Cues to decrease reliance on bilateral UEs and increase WB through bilateral LEs. Fatigue monitored throuhgout and two brief standing rest breaks required. Gait is very low and antalgic   Stairs            Wheelchair Mobility    Modified Rankin (Stroke Patients Only)       Balance     Sitting balance-Leahy Scale: Good       Standing balance-Leahy Scale: Poor                      Cognition Arousal/Alertness: Awake/alert Behavior During Therapy: WFL for tasks assessed/performed Overall Cognitive Status: Within Functional Limits for tasks assessed                      Exercises Total Joint Exercises Ankle Circles/Pumps: Strengthening;Both;10 reps;Supine Quad Sets: Strengthening;Both;10 reps;Supine Gluteal Sets: Strengthening;Both;10 reps;Supine Towel Squeeze: Strengthening;Both;10 reps;Supine Short Arc Quad: Strengthening;10 reps;Supine;Left (does not tolerate on RLE) Hip ABduction/ADduction: Strengthening;Both;10 reps;Supine Straight Leg Raises: Strengthening;Both;10 reps;Supine Long Arc Quad: Strengthening;10 reps;Seated;Right Goniometric ROM: -6 to 65 degrees AROM with OP, pain significantly limiting patient and she has a difficult time relating R quad    General Comments        Pertinent Vitals/Pain Pain Assessment: 0-10 Pain Score: 6  Pain Location: R knee Pain Intervention(s): Premedicated before session    Home Living                      Prior Function            PT Goals (current goals can now be found in the care plan section) Acute Rehab PT Goals Patient Stated Goal: To get better PT Goal Formulation: With patient Time For Goal  Achievement: 11/11/14 Potential to Achieve Goals: Good Progress towards PT goals: Progressing toward goals    Frequency  BID    PT Plan Current plan remains appropriate    Co-evaluation             End of Session Equipment Utilized During Treatment: Gait belt Activity Tolerance: Patient limited by pain Patient left: with SCD's reapplied;Other (comment);in chair;with call bell/phone within reach;with chair alarm set;with family/visitor present (towel roll under heel, polar care in place)     Time: 0830-     Charges:  $Gait Training: 8-22 mins $Therapeutic Exercise: 8-22 mins $Therapeutic Activity: 8-22 mins                    G Codes:      Sharalyn Ink Dora Simeone PT, DPT   Aleesha Ringstad 10/29/2014, 9:47 AM

## 2014-10-29 NOTE — Care Management Important Message (Signed)
Important Message  Patient Details  Name: Rachael Strickland MRN: [id number] Date of Birth: 11/22/1970   Medicare Important Message Given:  Yes-second notification given    Olegario Messier A Allmond 10/29/2014, 11:28 AM

## 2014-10-29 NOTE — Discharge Instructions (Signed)

## 2014-10-29 NOTE — Progress Notes (Signed)
Occupational Therapy Treatment Patient Details Name: Madiline Saffran MRN: [id number] DOB: 11-18-1970 Today's Date: 10/29/2014    History of present illness This patient is a 44 year old female who came to [facility] for a R TKR   OT comments  Patient doing much better today, able to dress self with contact guard assist and verbal cues for safety and technique.  Follow Up Recommendations       Equipment Recommendations       Recommendations for Other Services      Precautions / Restrictions Precautions Precautions: Fall;Knee Restrictions Weight Bearing Restrictions: Yes       Mobility Bed Mobility Overal bed mobility:  (Contact guard for sit to supine with verbal cues for technique)                Transfers Overall transfer level: Modified independent (contact guard assist for chair to bed.)                    Balance                                   ADL                                         General ADL Comments: Patient dressed herself with contact guard assist and verbal cues for safety and technique. Extra time needed but no assistive devices needed (except walker).      Vision                     Perception     Praxis      Cognition   Behavior During Therapy: WFL for tasks assessed/performed Overall Cognitive Status: Within Functional Limits for tasks assessed                       Extremity/Trunk Assessment               Exercises     Shoulder Instructions       General Comments      Pertinent Vitals/ Pain          Home Living                                          Prior Functioning/Environment              Frequency       Progress Toward Goals  OT Goals(current goals can now be found in the care plan section)  Progress towards OT goals: Progressing toward goals     Plan      Co-evaluation                 End of Session Equipment  Utilized During Treatment: Gait belt   Activity Tolerance Patient tolerated treatment well   Patient Left in bed;with call bell/phone within reach;with bed alarm set   Nurse Communication          Time: 4098-1191 OT Time Calculation (min): 23 min  Charges: OT General Charges $OT Visit: 1 Procedure OT Treatments $Self Care/Home Management : 23-37 mins  Gwyndolyn Kaufman, MS/OTR/L  10/29/2014, 2:47 PM

## 2014-10-29 NOTE — Progress Notes (Signed)
Physical Therapy Treatment Patient Details Name: Rachael Strickland MRN: [id number] DOB: 1970-05-30 Today's Date: 10/29/2014    History of Present Illness Pt underwent R TKR with acute blood loss anemia. She is POD#1 at time of evaluation. No falls in the last 12 months.     PT Comments    Pt demonstrates good progress with therapy. She is able to progress to reciprocal gait pattern with symmetrical step length. Pt reporting less pain with improved motivation and affect. Pt does have one LOB during ambulation requiring modA+1 assist from therapist to prevent falls. Pt able to tolerate seated exercises and demonstrates improved R knee flexion. Pt will benefit from skilled PT services to address deficits in strength, balance, and mobility in order to return to full function at home.    Follow Up Recommendations  SNF (Limited progress, 17 stairs to enter, limited support)     Equipment Recommendations  Rolling walker with 5" wheels (To be arranged at SNF)    Recommendations for Other Services       Precautions / Restrictions Precautions Precautions: Fall;Knee Required Braces or Orthoses: Knee Immobilizer - Right Knee Immobilizer - Right: Other (comment) (If unable to perform SLR) Restrictions Weight Bearing Restrictions: Yes RLE Weight Bearing: Weight bearing as tolerated    Mobility  Bed Mobility Overal bed mobility:  (Contact guard for sit to supine with verbal cues for technique)             General bed mobility comments: Received upright in recliner and left with OT in recliner  Transfers Overall transfer level: Needs assistance Equipment used: Rolling walker (2 wheeled) Transfers: Sit to/from Stand Sit to Stand: Min guard         General transfer comment: Improved sequencing this afternoon with better weight shift to RLE and decreased reports of pain  Ambulation/Gait Ambulation/Gait assistance: Min guard;Mod assist (One bout of modA+1 to prevent fall) Ambulation  Distance (Feet): 175 Feet Assistive device: Rolling walker (2 wheeled) Gait Pattern/deviations: Step-through pattern;Antalgic   Gait velocity interpretation: <1.8 ft/sec, indicative of risk for recurrent falls General Gait Details: Cues to improve R push off and R knee flexion during swing. Encouraged consistent pace with rolling walker and symmetrical step length. Fatigue and pain monitored throughout. Pt no longer requiring KI during ambulation. Pt does present with one LOB to the L requiring ModA+1 from therapist to prevent her from falling to the L.   Stairs            Wheelchair Mobility    Modified Rankin (Stroke Patients Only)       Balance     Sitting balance-Leahy Scale: Good       Standing balance-Leahy Scale: Poor                      Cognition Arousal/Alertness: Awake/alert Behavior During Therapy: WFL for tasks assessed/performed Overall Cognitive Status: Within Functional Limits for tasks assessed                      Exercises Total Joint Exercises Towel Squeeze: Strengthening;Both;10 reps;Seated Hip ABduction/ADduction: Strengthening;Both;10 reps;Seated Long Arc Quad: Strengthening;10 reps;Seated;Both Knee Flexion: Strengthening;Both;10 reps;Seated Marching in Standing: Strengthening;Both;10 reps;Seated    General Comments        Pertinent Vitals/Pain Pain Assessment: No/denies pain (Mild increase in pain with ambulation but does not rate)    Home Living  Prior Function            PT Goals (current goals can now be found in the care plan section) Acute Rehab PT Goals Patient Stated Goal: "I want for my leg to feel better." PT Goal Formulation: With patient Time For Goal Achievement: 11/11/14 Potential to Achieve Goals: Good Progress towards PT goals: Progressing toward goals    Frequency  BID    PT Plan Current plan remains appropriate    Co-evaluation             End of  Session Equipment Utilized During Treatment: Gait belt Activity Tolerance: Patient limited by pain Patient left: in chair (with OT who agrees to arrange pt in bed after therapy)     Time: 1351-1415 PT Time Calculation (min) (ACUTE ONLY): 24 min  Charges:  $Gait Training: 8-22 mins $Therapeutic Exercise: 8-22 mins                    G Codes:      Sharalyn Ink Ruthe Roemer PT, DPT   Rachael Strickland 10/29/2014, 3:26 PM

## 2014-10-29 NOTE — Progress Notes (Signed)
Clinical Education officer, museum (CSW) met with patient and her case Freight forwarder from [employer]. CSW gave bed offers. Patient chose [facility]. CSW left voicemail with [facility] admissions coordinator at [place] making him aware of above. CSW will continue to follow and assist as needed.   Blima Rich, [place] [phone]

## 2014-10-29 NOTE — Discharge Summary (Signed)
Physician Discharge Summary  Patient ID: Rachael Strickland MRN: [id number] DOB/AGE: Aug 12, 1970 44 y.o.  Admit date: 10/27/2014 Discharge date: 10/29/2014  Admission Diagnoses:  DEGENERATIVE OSTEOARTHRITIS RT KNEE   Discharge Diagnoses: Patient Active Problem List   Diagnosis Date Noted  . Traumatic arthritis of knee 10/27/2014    Past Medical History  Diagnosis Date  . Hypertension   . Elevated lipids   . Diabetes mellitus without complication   . GERD (gastroesophageal reflux disease)      Transfusion:   None   Consultants (if any):    Discharged Condition: Improved  Hospital Course: Rachael Strickland is an 44 y.o. female who was admitted 10/27/2014 with a diagnosis of  Right knee osteoarthritis posttraumatic and went to the operating room on 10/27/2014 and underwent the above named procedures.    Surgeries: Procedure(s): TOTAL KNEE ARTHROPLASTY on 10/27/2014 Patient tolerated the surgery well. Taken to PACU where she was stabilized and then transferred to the orthopedic floor.  Started on Lovenox 30 q  12 hrs. Foot pumps applied bilaterally at 80 mm. Heels elevated on bed with rolled towels. No evidence of DVT. Negative Homan. Physical therapy started on day #1 for gait training and transfer. OT started day #1 for ADL and assisted devices.  Patient's foley was d/c on day #1. Patient's IV and hemovac was d/c on day #2.  On post op day #3 patient was stable and ready for discharge to  Rehab facility.  Implants: Medacta GMK sphere 2+ right femoral component, 2 tibial component 10 mm insert and 29 mm patella all components cemented    She was given perioperative antibiotics:  Anti-infectives    Start     Dose/Rate Route Frequency Ordered Stop   10/27/14 1215  ceFAZolin (ANCEF) IVPB 2 g/50 mL premix     2 g 100 mL/hr over 30 Minutes Intravenous Every 6 hours 10/27/14 1208 10/28/14 0350   10/27/14 0821  ceFAZolin (ANCEF) 1-5 GM-% IVPB    Comments:  Machia, Millissa: cabinet  override      10/27/14 0821 10/27/14 2029   10/27/14 0715  ceFAZolin (ANCEF) IVPB 2 g/50 mL premix     2 g 100 mL/hr over 30 Minutes Intravenous  Once 10/27/14 0708 10/27/14 0908    .  She was given sequential compression devices, early ambulation, and lovenox for DVT prophylaxis.  She benefited maximally from the hospital stay and there were no complications.    Recent vital signs:  Filed Vitals:   10/29/14 0853  BP: 133/76  Pulse:   Temp:   Resp:     Recent laboratory studies:  Lab Results  Component Value Date   HGB 12.1 10/29/2014   HGB 11.0* 10/28/2014   HGB 12.0 10/27/2014   Lab Results  Component Value Date   WBC 11.0 10/29/2014   PLT 301 10/29/2014   Lab Results  Component Value Date   INR 1.02 10/14/2014   Lab Results  Component Value Date   NA 138 10/28/2014   K 4.1 10/28/2014   CL 108 10/28/2014   CO2 23 10/28/2014   BUN 9 10/28/2014   CREATININE 1.19* 10/28/2014   GLUCOSE 117* 10/28/2014    Discharge Medications:     Medication List    TAKE these medications        atorvastatin 20 MG tablet  Commonly known as:  LIPITOR  Take 20 mg by mouth at bedtime.     enoxaparin 40 MG/0.4ML injection  Commonly known as:  LOVENOX  Inject  0.4 mLs (40 mg total) into the skin every 12 (twelve) hours.     hydrochlorothiazide 25 MG tablet  Commonly known as:  HYDRODIURIL  Take 25 mg by mouth daily.     lisinopril 2.5 MG tablet  Commonly known as:  PRINIVIL,ZESTRIL  Take 2.5 mg by mouth daily.     metFORMIN 500 MG tablet  Commonly known as:  GLUCOPHAGE  Take 500 mg by mouth 2 (two) times daily.     oxyCODONE 5 MG immediate release tablet  Commonly known as:  Oxy IR/ROXICODONE  Take 1-2 tablets (5-10 mg total) by mouth every 3 (three) hours as needed for breakthrough pain.     potassium chloride 10 MEQ tablet  Commonly known as:  K-DUR,KLOR-CON  Take 10 mEq by mouth 3 (three) times daily.     ranitidine 150 MG tablet  Commonly known as:   ZANTAC  Take 150 mg by mouth 2 (two) times daily.     topiramate 100 MG tablet  Commonly known as:  TOPAMAX  Take 50-100 mg by mouth 2 (two) times daily. Pt takes one-half tablet in the morning and one at bedtime.        Diagnostic Studies: Dg Knee 1-2 Views Right  10/27/2014   CLINICAL DATA:  Postop right knee  EXAM: RIGHT KNEE - 1-2 VIEW  COMPARISON:  None.  FINDINGS: Two views of the right knee submitted. There is a right knee prosthesis with anatomic alignment. Postsurgical changes are noted with midline skin staples.  IMPRESSION: Right knee prosthesis with anatomic alignment. Postsurgical changes are noted.   Electronically Signed   By: Natasha Mead M.D.   On: 10/27/2014 11:31    Disposition: Discharge to rehab facility on postop day 3, medically stable.        Follow-up Information    Follow up with MENZ,MICHAEL, MD In 2 weeks.   Specialty:  Orthopedic Surgery   Why:  For staple removal and skin check   Contact information:   [address] [place] [address] [phone]        Signed: Patience Musca 10/29/2014, 10:31 AM

## 2014-10-29 NOTE — Progress Notes (Signed)
   Subjective: 2 Days Post-Op Procedure(s) (LRB): TOTAL KNEE ARTHROPLASTY (Right) Patient reports pain as 8 on 0-10 scale.   Patient is well, and has had no acute complaints or problems We will start therapy today.  Plan is to go Rehab after hospital stay.  Objective: Vital signs in last 24 hours: Temp:  [98.6 F (37 C)-100.7 F (38.2 C)] 99.1 F (37.3 C) (07/21 0423) Pulse Rate:  [71-92] 88 (07/21 0423) Resp:  [16-18] 18 (07/21 0423) BP: (105-127)/(57-82) 111/57 mmHg (07/21 0423) SpO2:  [97 %-100 %] 97 % (07/21 0423)  Intake/Output from previous day: 07/20 0701 - 07/21 0700 In: 600 [P.O.:600] Out: 1800 [Urine:1800] Intake/Output this shift:     Recent Labs  10/27/14 0834 10/27/14 1317 10/28/14 0614 10/29/14 0702  HGB 12.69 12.0 11.0* 12.1    Recent Labs  10/28/14 0614 10/29/14 0702  WBC 8.6 11.0  RBC 4.10 4.57  HCT 34.1* 37.6  PLT 276 301    Recent Labs  10/27/14 0834 10/27/14 1317 10/28/14 0614  NA 139  --  138  K 6.2  --  4.1  CL  --   --  108  CO2  --   --  23  BUN  --   --  9  CREATININE  --  1.14* 1.19*  GLUCOSE  --   --  117*  CALCIUM  --   --  8.7*   No results for input(s): LABPT, INR in the last 72 hours.  EXAM General - Patient is Alert, Appropriate and Oriented Extremity - Neurovascular intact Sensation intact distally Intact pulses distally Dorsiflexion/Plantar flexion intact Dressing - dressing C/D/I and no drainage Motor Function - intact, moving foot and toes well on exam.   Past Medical History  Diagnosis Date  . Hypertension   . Elevated lipids   . Diabetes mellitus without complication   . GERD (gastroesophageal reflux disease)     Assessment/Plan:   2 Days Post-Op Procedure(s) (LRB): TOTAL KNEE ARTHROPLASTY (Right) Active Problems:   Traumatic arthritis of knee   Acute post op blood loss anemia   Estimated body mass index is 41 kg/(m^2) as calculated from the following:   Height as of this encounter:   (1.626 m).   Weight as of this encounter: 108.41 kg (239 lb). Advance diet Up with therapy  Needs BM before discharge   DVT Prophylaxis - Lovenox, Foot Pumps and TED hose Weight-Bearing as tolerated to right leg D/C O2 and Pulse OX and try on Room Air  T. Cranston Neighbor, PA-C [facility] 10/29/2014, 7:14 AM

## 2014-10-30 LAB — GLUCOSE, CAPILLARY
Glucose-Capillary: 105 mg/dL — ABNORMAL HIGH (ref 65–99)
Glucose-Capillary: 93 mg/dL (ref 65–99)

## 2014-10-30 NOTE — Progress Notes (Signed)
Physical Therapy Treatment Patient Details Name: Rachael Strickland MRN: [id number] DOB: 1971-04-07 Today's Date: 10/30/2014    History of Present Illness Pt underwent R TKR with acute blood loss anemia. No falls in the last 12 months.     PT Comments    Pt progressing with gait technique using RW but still requiring intermittent vc's for improving technique and for safety.  Pt's R knee flexion gradually improving (to 72 degrees today) but limited d/t pain (pt also appears to have difficulty relaxing R quad even with vc's and tactile cues).  Plan for discharge to STR today.  Follow Up Recommendations  SNF     Equipment Recommendations  Rolling walker with 5" wheels    Recommendations for Other Services       Precautions / Restrictions Precautions Precautions: Fall;Knee Restrictions Weight Bearing Restrictions: Yes RLE Weight Bearing: Weight bearing as tolerated    Mobility  Bed Mobility               General bed mobility comments: Received upright in recliner and left in recliner  Transfers Overall transfer level: Needs assistance Equipment used: Rolling walker (2 wheeled) Transfers: Sit to/from Stand Sit to Stand: Min guard         General transfer comment: no vc's required for hand or feet placement  Ambulation/Gait Ambulation/Gait assistance: Min guard Ambulation Distance (Feet): 180 Feet Assistive device: Rolling walker (2 wheeled) Gait Pattern/deviations: Step-through pattern;Antalgic   Gait velocity interpretation: <1.8 ft/sec, indicative of risk for recurrent falls General Gait Details: Intermittent vc's required to increase R hip and knee flexion during swing phase; significant decreased cadence noted   Stairs            Wheelchair Mobility    Modified Rankin (Stroke Patients Only)       Balance                                    Cognition Arousal/Alertness: Awake/alert Behavior During Therapy: WFL for tasks  assessed/performed Overall Cognitive Status: Within Functional Limits for tasks assessed                      Exercises Total Joint Exercises Goniometric ROM: R knee extension 8 degrees short of neutral (with knee extended in recliner); R knee flexion 72 degrees (in sitting)    General Comments   Nursing cleared pt for participation in physical therapy.  Pt agreeable to PT session.       Pertinent Vitals/Pain Pain Assessment: 0-10 Pain Score: 5  Pain Location: R knee Pain Descriptors / Indicators: Sore;Tender Pain Intervention(s): Limited activity within patient's tolerance;Monitored during session;Premedicated before session;Repositioned;Ice applied  Vitals stable and WFL throughout treatment session.     Home Living                      Prior Function            PT Goals (current goals can now be found in the care plan section) Acute Rehab PT Goals Patient Stated Goal: To have less R knee pain PT Goal Formulation: With patient Time For Goal Achievement: 11/11/14 Potential to Achieve Goals: Good Progress towards PT goals: Progressing toward goals    Frequency  BID    PT Plan Current plan remains appropriate    Co-evaluation             End of Session Equipment  Utilized During Treatment: Gait belt Activity Tolerance: Patient limited by pain Patient left: in chair;with call bell/phone within reach;with chair alarm set;with SCD's reapplied (B heels elevated via towel roll; polar care applied)     Time: 1610-9604 PT Time Calculation (min) (ACUTE ONLY): 47 min  Charges:  $Gait Training: 23-37 mins $Therapeutic Exercise: 8-22 mins                    G CodesHendricks Limes 31-Oct-2014, 11:10 AM Hendricks Limes, PT [phone]

## 2014-10-30 NOTE — Progress Notes (Signed)
Discharge with EMS staff via stretcher.  

## 2014-10-30 NOTE — Progress Notes (Signed)
Patient to go to [facility], Report called to the nurse Wilford Sports at [phone], EMS called at [phone]. Rx.slip in packet, honeycomb dressing sent in packet also.

## 2014-10-30 NOTE — Clinical Social Work Placement (Signed)
   CLINICAL SOCIAL WORK PLACEMENT  NOTE  Date:  10/30/2014  Patient Details  Name: Rachael Strickland MRN: [id number] Date of Birth: Feb 09, 1971  Clinical Social Work is seeking post-discharge placement for this patient at the Skilled  Nursing Facility level of care (*CSW will initial, date and re-position this form in  chart as items are completed):  Yes   Patient/family provided with [place] Clinical Social Work Department's list of facilities offering this level of care within the geographic area requested by the patient (or if unable, by the patient's family).  Yes   Patient/family informed of their freedom to choose among providers that offer the needed level of care, that participate in Medicare, Medicaid or managed care program needed by the patient, have an available bed and are willing to accept the patient.  Yes   Patient/family informed of [facility]'s ownership interest in [facility] and [facility], as well as of the fact that they are under no obligation to receive care at these facilities.  PASRR submitted to EDS on 10/28/14     PASRR number received on 10/28/14     Existing PASRR number confirmed on       FL2 transmitted to all facilities in geographic area requested by pt/family on 10/28/14     FL2 transmitted to all facilities within larger geographic area on       Patient informed that his/her managed care company has contracts with or will negotiate with certain facilities, including the following:        Yes   Patient/family informed of bed offers received.  Patient chooses bed at  [facility] )     Physician recommends and patient chooses bed at      Patient to be transferred to  [facility] ) on 10/30/14.  Patient to be transferred to facility by  [facility] )     Patient family notified on 10/30/14 of transfer.  Name of family member notified:        PHYSICIAN       Additional Comment:     _______________________________________________ Haig Prophet, LCSW 10/30/2014, 9:57 AM

## 2014-10-30 NOTE — Progress Notes (Addendum)
Patient is medically stable for D/C to [facility] today. Per [facility] admissions coordinator at [facility] patient is going to room 505. RN will call report to 500 hall and arrange EMS for transport. Clinical Child psychotherapist (CSW) prepared D/C packet and sent D/C summary and follow up appointment to [facility] via [facility]. Patient is aware of above. Patient made her family aware of D/C. Please reconsult if future social work needs arise. CSW signing off.   Jetta Lout, LCSWA [phone]

## 2014-10-30 NOTE — Progress Notes (Signed)
   Subjective: 3 Days Post-Op Procedure(s) (LRB): TOTAL KNEE ARTHROPLASTY (Right) Patient reports pain as mild  Patient is well, and has had no acute complaints or problems We will continue with therapy today.  Plan is to go Rehab today  Objective: Vital signs in last 24 hours: Temp:  [98.4 F (36.9 C)-99.3 F (37.4 C)] 98.7 F (37.1 C) (07/22 0739) Pulse Rate:  [86-108] 86 (07/22 0739) Resp:  [14-18] 18 (07/22 0739) BP: (95-133)/(51-76) 104/51 mmHg (07/22 0739) SpO2:  [97 %-100 %] 99 % (07/22 0739)  Intake/Output from previous day: 07/21 0701 - 07/22 0700 In: 120 [P.O.:120] Out: 1375 [Urine:1375] Intake/Output this shift:     Recent Labs  10/27/14 0834 10/27/14 1317 10/28/14 0614 10/29/14 0702  HGB 12.69 12.0 11.0* 12.1    Recent Labs  10/28/14 0614 10/29/14 0702  WBC 8.6 11.0  RBC 4.10 4.57  HCT 34.1* 37.6  PLT 276 301    Recent Labs  10/27/14 0834 10/27/14 1317 10/28/14 0614  NA 139  --  138  K 6.2  --  4.1  CL  --   --  108  CO2  --   --  23  BUN  --   --  9  CREATININE  --  1.14* 1.19*  GLUCOSE  --   --  117*  CALCIUM  --   --  8.7*   No results for input(s): LABPT, INR in the last 72 hours.  EXAM General - Patient is Alert, Appropriate and Oriented Extremity - Neurovascular intact Sensation intact distally Intact pulses distally Dorsiflexion/Plantar flexion intact Dressing - dressing C/D/I and no drainage Motor Function - intact, moving foot and toes well on exam.   Past Medical History  Diagnosis Date  . Hypertension   . Elevated lipids   . Diabetes mellitus without complication   . GERD (gastroesophageal reflux disease)     Assessment/Plan:   3 Days Post-Op Procedure(s) (LRB): TOTAL KNEE ARTHROPLASTY (Right) Active Problems:   Traumatic arthritis of knee   Acute post op blood loss anemia   Estimated body mass index is 41 kg/(m^2) as calculated from the following:   Height as of this encounter:  (1.626 m).   Weight  as of this encounter: 108.41 kg (239 lb). Advance diet Up with therapy  Discharge to rehab today, follow up with [facility] in 2 weeks   DVT Prophylaxis - Lovenox, Foot Pumps and TED hose Weight-Bearing as tolerated to right leg D/C O2 and Pulse OX and try on Room Air  T. Cranston Neighbor, PA-C [facility] 10/30/2014, 7:44 AM

## 2014-11-06 NOTE — Addendum Note (Signed)
Addendum  created 11/06/14 1633 by Kennadi Albany F Van Staveren, MD   Modules edited: Anesthesia Events, Narrator   Narrator:  Narrator: Event Log Edited    

## 2014-11-25 ENCOUNTER — Encounter: Payer: Self-pay | Admitting: *Deleted

## 2014-11-25 DIAGNOSIS — E119 Type 2 diabetes mellitus without complications: Secondary | ICD-10-CM | POA: Diagnosis not present

## 2014-11-25 DIAGNOSIS — Z79899 Other long term (current) drug therapy: Secondary | ICD-10-CM | POA: Diagnosis not present

## 2014-11-25 DIAGNOSIS — M24661 Ankylosis, right knee: Secondary | ICD-10-CM | POA: Diagnosis not present

## 2014-11-25 DIAGNOSIS — K219 Gastro-esophageal reflux disease without esophagitis: Secondary | ICD-10-CM | POA: Diagnosis not present

## 2014-11-25 DIAGNOSIS — Z888 Allergy status to other drugs, medicaments and biological substances status: Secondary | ICD-10-CM | POA: Diagnosis not present

## 2014-11-25 DIAGNOSIS — I10 Essential (primary) hypertension: Secondary | ICD-10-CM | POA: Diagnosis not present

## 2014-11-25 DIAGNOSIS — Z87891 Personal history of nicotine dependence: Secondary | ICD-10-CM | POA: Diagnosis not present

## 2014-11-25 DIAGNOSIS — Z881 Allergy status to other antibiotic agents status: Secondary | ICD-10-CM | POA: Diagnosis not present

## 2014-11-25 NOTE — Patient Instructions (Signed)
  Your procedure is scheduled on: 11/26/14 Thurs 06:00  Report to Day Surgery..  Remember: Instructions that are not followed completely may result in serious medical risk, up to and including death, or upon the discretion of your surgeon and anesthesiologist your surgery may need to be rescheduled.    __x__ 1. Do not eat food or drink liquids after midnight. No gum chewing or hard candies.     ____ 2. No Alcohol for 24 hours before or after surgery.   ____ 3. Bring all medications with you on the day of surgery if instructed.    ___x_ 4. Notify your doctor if there is any change in your medical condition     (cold, fever, infections).     Do not wear jewelry, make-up, hairpins, clips or nail polish.  Do not wear lotions, powders, or perfumes. You may wear deodorant.  Do not shave 48 hours prior to surgery. Men may shave face and neck.  Do not bring valuables to the hospital.    [facility] is not responsible for any belongings or valuables.               Contacts, dentures or bridgework may not be worn into surgery.  Leave your suitcase in the car. After surgery it may be brought to your room.  For patients admitted to the hospital, discharge time is determined by your                treatment team.   Patients discharged the day of surgery will not be allowed to drive home.   Please read over the following fact sheets that you were given:      _x___ Take these medicines the morning of surgery with A SIP OF WATER:    1. lisinopril (PRINIVIL,ZESTRIL) 2.5 MG tablet  2. topiramate (TOPAMAX) 100 MG tablet  3.   4.  5.  6.  ____ Fleet Enema (as directed)   __x__ Use CHG Soap as directed Sage wipes AM of surgery  ____ Use inhalers on the day of surgery  _x___ Stop metformin 2 days prior to surgery No metformin tonight or tomorrow AM    ____ Take 1/2 of usual insulin dose the night before surgery and none on the morning of surgery.   ____ Stop Coumadin/Plavix/aspirin on    ____ Stop Anti-inflammatories on    ____ Stop supplements until after surgery.    ____ Bring C-Pap to the hospital.

## 2014-11-26 ENCOUNTER — Ambulatory Visit: Payer: Medicare Other | Admitting: Anesthesiology

## 2014-11-26 ENCOUNTER — Encounter: Payer: Self-pay | Admitting: *Deleted

## 2014-11-26 ENCOUNTER — Ambulatory Visit
Admission: RE | Admit: 2014-11-26 | Discharge: 2014-11-26 | Disposition: A | Discharge status: Home / Self Care | Payer: Medicare Other | Source: Ambulatory Visit | Attending: Orthopedic Surgery | Admitting: Orthopedic Surgery

## 2014-11-26 ENCOUNTER — Encounter
Admission: RE | Disposition: A | Discharge status: Home / Self Care | Payer: Self-pay | Source: Ambulatory Visit | Attending: Orthopedic Surgery

## 2014-11-26 DIAGNOSIS — I10 Essential (primary) hypertension: Secondary | ICD-10-CM | POA: Insufficient documentation

## 2014-11-26 DIAGNOSIS — M24661 Ankylosis, right knee: Secondary | ICD-10-CM | POA: Diagnosis not present

## 2014-11-26 DIAGNOSIS — Z888 Allergy status to other drugs, medicaments and biological substances status: Secondary | ICD-10-CM | POA: Insufficient documentation

## 2014-11-26 DIAGNOSIS — E119 Type 2 diabetes mellitus without complications: Secondary | ICD-10-CM | POA: Insufficient documentation

## 2014-11-26 DIAGNOSIS — Z79899 Other long term (current) drug therapy: Secondary | ICD-10-CM | POA: Insufficient documentation

## 2014-11-26 DIAGNOSIS — Z881 Allergy status to other antibiotic agents status: Secondary | ICD-10-CM | POA: Insufficient documentation

## 2014-11-26 DIAGNOSIS — K219 Gastro-esophageal reflux disease without esophagitis: Secondary | ICD-10-CM | POA: Insufficient documentation

## 2014-11-26 DIAGNOSIS — Z87891 Personal history of nicotine dependence: Secondary | ICD-10-CM | POA: Insufficient documentation

## 2014-11-26 HISTORY — PX: KNEE CLOSED REDUCTION: SHX995

## 2014-11-26 LAB — GLUCOSE, CAPILLARY
GLUCOSE-CAPILLARY: 94 mg/dL (ref 65–99)
Glucose-Capillary: 104 mg/dL — ABNORMAL HIGH (ref 65–99)

## 2014-11-26 SURGERY — MANIPULATION, KNEE, CLOSED
Anesthesia: General | Laterality: Right

## 2014-11-26 MED ORDER — PROPOFOL 10 MG/ML IV BOLUS
INTRAVENOUS | Status: DC | PRN
  Administered 2014-11-26: 150 mg via INTRAVENOUS

## 2014-11-26 MED ORDER — FENTANYL CITRATE (PF) 100 MCG/2ML IJ SOLN
25.0000 ug | INTRAMUSCULAR | Status: DC | PRN
  Administered 2014-11-26 (×4): 25 ug via INTRAVENOUS

## 2014-11-26 MED ORDER — ONDANSETRON HCL 4 MG/2ML IJ SOLN
INTRAMUSCULAR | Status: DC | PRN
  Administered 2014-11-26: 4 mg via INTRAVENOUS

## 2014-11-26 MED ORDER — ALFENTANIL 500 MCG/ML IJ INJ
INJECTION | INTRAMUSCULAR | Status: DC | PRN
  Administered 2014-11-26: 1000 ug via INTRAVENOUS

## 2014-11-26 MED ORDER — ONDANSETRON HCL 4 MG/2ML IJ SOLN: 4.0000 mg | Freq: Four times a day (QID) | INTRAMUSCULAR | Status: DC | PRN

## 2014-11-26 MED ORDER — LIDOCAINE HCL (CARDIAC) 20 MG/ML IV SOLN
INTRAVENOUS | Status: DC | PRN
  Administered 2014-11-26 (×2): 100 mg via INTRAVENOUS

## 2014-11-26 MED ORDER — SODIUM CHLORIDE 0.9 % IV SOLN: INTRAVENOUS | Status: DC

## 2014-11-26 MED ORDER — ONDANSETRON HCL 4 MG PO TABS: 4.0000 mg | ORAL_TABLET | Freq: Four times a day (QID) | ORAL | Status: DC | PRN

## 2014-11-26 MED ORDER — METOCLOPRAMIDE HCL 10 MG PO TABS: 5.0000 mg | ORAL_TABLET | Freq: Three times a day (TID) | ORAL | Status: DC | PRN

## 2014-11-26 MED ORDER — HYDROCODONE-ACETAMINOPHEN 5-325 MG PO TABS
1.0000 | ORAL_TABLET | ORAL | Status: DC | PRN
  Administered 2014-11-26: 2 via ORAL

## 2014-11-26 MED ORDER — ONDANSETRON HCL 4 MG/2ML IJ SOLN: 4.0000 mg | Freq: Once | INTRAMUSCULAR | Status: DC | PRN

## 2014-11-26 MED ORDER — METOCLOPRAMIDE HCL 5 MG/ML IJ SOLN: 5.0000 mg | Freq: Three times a day (TID) | INTRAMUSCULAR | Status: DC | PRN

## 2014-11-26 MED ORDER — MIDAZOLAM HCL 2 MG/2ML IJ SOLN
INTRAMUSCULAR | Status: DC | PRN
  Administered 2014-11-26: 2 mg via INTRAVENOUS

## 2014-11-26 MED ORDER — FENTANYL CITRATE (PF) 100 MCG/2ML IJ SOLN
INTRAMUSCULAR | Status: DC | PRN
  Administered 2014-11-26: 100 ug via INTRAVENOUS

## 2014-11-26 MED ORDER — ACETAMINOPHEN 10 MG/ML IV SOLN
INTRAVENOUS | Status: AC
  Filled 2014-11-26: qty 100

## 2014-11-26 MED ORDER — HYDROCODONE-ACETAMINOPHEN 5-325 MG PO TABS
ORAL_TABLET | ORAL | Status: AC
  Filled 2014-11-26: qty 2

## 2014-11-26 MED ORDER — DEXAMETHASONE SODIUM PHOSPHATE 4 MG/ML IJ SOLN
INTRAMUSCULAR | Status: DC | PRN
  Administered 2014-11-26: 5 mg via INTRAVENOUS

## 2014-11-26 MED ORDER — SODIUM CHLORIDE 0.9 % IV SOLN
INTRAVENOUS | Status: DC
  Administered 2014-11-26: 07:00:00 via INTRAVENOUS

## 2014-11-26 MED ORDER — ACETAMINOPHEN 10 MG/ML IV SOLN
INTRAVENOUS | Status: DC | PRN
  Administered 2014-11-26: 1000 mg via INTRAVENOUS

## 2014-11-26 MED ORDER — FENTANYL CITRATE (PF) 100 MCG/2ML IJ SOLN
INTRAMUSCULAR | Status: AC
  Administered 2014-11-26: 25 ug via INTRAVENOUS
  Filled 2014-11-26: qty 2

## 2014-11-26 MED ORDER — SUCCINYLCHOLINE CHLORIDE 20 MG/ML IJ SOLN
INTRAMUSCULAR | Status: DC | PRN
  Administered 2014-11-26: 100 mg via INTRAVENOUS

## 2014-11-26 MED ORDER — OXYCODONE HCL 5 MG PO TABS: 5.0000 mg | ORAL_TABLET | ORAL | Status: DC | PRN

## 2014-11-26 SURGICAL SUPPLY — 1 items: KIT RM TURNOVER STRD PROC AR (KITS) ×2 IMPLANT

## 2014-11-26 NOTE — Transfer of Care (Signed)
Immediate Anesthesia Transfer of Care Note  Patient: Rachael Strickland  Procedure(s) Performed: Procedure(s): CLOSED MANIPULATION KNEE (Right)  Patient Location: PACU  Anesthesia Type:General  Level of Consciousness: awake, alert , oriented and patient cooperative  Airway & Oxygen Therapy: Patient Spontanous Breathing and Patient connected to nasal cannula oxygen  Post-op Assessment: Report given to RN and Post -op Vital signs reviewed and stable  Post vital signs: Reviewed and stable  Last Vitals:  Filed Vitals:   11/26/14 0747  BP: 117/73  Pulse: 87  Temp: 36.1 C  Resp: 12    Complications: No apparent anesthesia complications

## 2014-11-26 NOTE — H&P (Signed)
Reviewed paper H+P, will be scanned into chart. No changes noted.  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 Plan knee manipulation under general anesthesia

## 2014-11-26 NOTE — Discharge Instructions (Signed)
Work hard on range of motion. Use Polar Care to knee as needed for swelling.

## 2014-11-26 NOTE — Anesthesia Postprocedure Evaluation (Signed)
  Anesthesia Post-op Note  Patient: Rachael Strickland  Procedure(s) Performed: Procedure(s): CLOSED MANIPULATION KNEE (Right)  Anesthesia type:General  Patient location: PACU  Post pain: Pain level controlled  Post assessment: Post-op Vital signs reviewed, Patient's Cardiovascular Status Stable, Respiratory Function Stable, Patent Airway and No signs of Nausea or vomiting  Post vital signs: Reviewed and stable  Last Vitals:  Filed Vitals:   11/26/14 0747  BP: 117/73  Pulse: 87  Temp: 36.1 C  Resp: 12    Level of consciousness: awake, alert  and patient cooperative  Complications: No apparent anesthesia complications

## 2014-11-26 NOTE — Op Note (Signed)
11/26/2014  7:32 AM  PATIENT:  Rachael Strickland  44 y.o. female  PRE-OPERATIVE DIAGNOSIS:  STATUS PO RIGHT KNEE  POST-OPERATIVE DIAGNOSIS:  STATUS PO RIGHT KNEE  PROCEDURE:  Procedure(s): CLOSED MANIPULATION KNEE (Right)  SURGEON: Leitha Schuller, MD  ASSISTANTS: None  ANESTHESIA:   general  EBL:    none   BLOOD ADMINISTERED:none  DRAINS: none   LOCAL MEDICATIONS USED:  NONE  SPECIMEN:  No Specimen  DISPOSITION OF SPECIMEN:  N/A  COUNTS:  NO Case was closed case no counts required  TOURNIQUET:  * No tourniquets in log *  IMPLANTS: None  DICTATION: .Dragon Dictation patient was brought to the operating room and after adequate general anesthesia was obtained appropriate patient education timeout procedures completed. With the leg just held up and flexion was only approximately 30-40. The leg was brought up into flexion and with the hip in flexion and gentle pressure by the proximal tibia 100 flexion was obtained. Extension obtained with 6 successful manipulation patient tolerated the procedure well  PLAN OF CARE: Discharge to home after PACU  PATIENT DISPOSITION:  PACU - hemodynamically stable.

## 2014-11-26 NOTE — Anesthesia Preprocedure Evaluation (Signed)
Anesthesia Evaluation  Patient identified by MRN, date of birth, ID band Patient awake    Reviewed: Allergy & Precautions, NPO status , Patient's Chart, lab work & pertinent test results  Airway Mallampati: II       Dental  (+) Chipped   Pulmonary former smoker,  breath sounds clear to auscultation  Pulmonary exam normal       Cardiovascular hypertension, Normal cardiovascular exam    Neuro/Psych    GI/Hepatic Neg liver ROS, GERD-  Medicated and Controlled,  Endo/Other  diabetes, Well Controlled, Type 2  Renal/GU negative Renal ROS     Musculoskeletal negative musculoskeletal ROS (+)   Abdominal Normal abdominal exam  (+)   Peds  Hematology negative hematology ROS (+)   Anesthesia Other Findings   Reproductive/Obstetrics                             Anesthesia Physical Anesthesia Plan  ASA: II  Anesthesia Plan: General   Post-op Pain Management:    Induction: Intravenous, Rapid sequence and Cricoid pressure planned  Airway Management Planned: Oral ETT  Additional Equipment:   Intra-op Plan:   Post-operative Plan: Extubation in OR  Informed Consent: I have reviewed the patients History and Physical, chart, labs and discussed the procedure including the risks, benefits and alternatives for the proposed anesthesia with the patient or authorized representative who has indicated his/her understanding and acceptance.   Dental advisory given  Plan Discussed with: CRNA and Surgeon  Anesthesia Plan Comments:         Anesthesia Quick Evaluation

## 2014-11-26 NOTE — Anesthesia Procedure Notes (Signed)
Procedure Name: Intubation Date/Time: 11/26/2014 7:24 AM Performed by: Shirlee Limerick, Luchiano Viscomi Pre-anesthesia Checklist: Patient identified, Emergency Drugs available, Suction available and Patient being monitored Patient Re-evaluated:Patient Re-evaluated prior to inductionPreoxygenation: Pre-oxygenation with 100% oxygen Intubation Type: IV induction Grade View: Grade I Tube type: Oral Tube size: 7.0 mm Number of attempts: 1 Placement Confirmation: ETT inserted through vocal cords under direct vision,  positive ETCO2 and breath sounds checked- equal and bilateral Secured at: 21 cm Tube secured with: Tape Dental Injury: Teeth and Oropharynx as per pre-operative assessment

## 2015-03-10 ENCOUNTER — Emergency Department
Admission: EM | Admit: 2015-03-10 | Discharge: 2015-03-10 | Disposition: A | Discharge status: Home / Self Care | Payer: Medicare Other | Attending: Emergency Medicine | Admitting: Emergency Medicine

## 2015-03-10 ENCOUNTER — Emergency Department: Payer: Medicare Other

## 2015-03-10 DIAGNOSIS — R0789 Other chest pain: Secondary | ICD-10-CM | POA: Insufficient documentation

## 2015-03-10 DIAGNOSIS — F1410 Cocaine abuse, uncomplicated: Secondary | ICD-10-CM | POA: Diagnosis not present

## 2015-03-10 DIAGNOSIS — R059 Cough, unspecified: Secondary | ICD-10-CM

## 2015-03-10 DIAGNOSIS — Z87891 Personal history of nicotine dependence: Secondary | ICD-10-CM | POA: Diagnosis not present

## 2015-03-10 DIAGNOSIS — E119 Type 2 diabetes mellitus without complications: Secondary | ICD-10-CM | POA: Insufficient documentation

## 2015-03-10 DIAGNOSIS — R079 Chest pain, unspecified: Secondary | ICD-10-CM | POA: Diagnosis present

## 2015-03-10 DIAGNOSIS — R0781 Pleurodynia: Secondary | ICD-10-CM

## 2015-03-10 DIAGNOSIS — J069 Acute upper respiratory infection, unspecified: Secondary | ICD-10-CM | POA: Diagnosis not present

## 2015-03-10 DIAGNOSIS — I10 Essential (primary) hypertension: Secondary | ICD-10-CM | POA: Diagnosis not present

## 2015-03-10 DIAGNOSIS — R05 Cough: Secondary | ICD-10-CM

## 2015-03-10 LAB — CBC
HCT: 38.8 % (ref 35.0–47.0)
HEMOGLOBIN: 12.6 g/dL (ref 12.0–16.0)
MCH: 26.9 pg (ref 26.0–34.0)
MCHC: 32.5 g/dL (ref 32.0–36.0)
MCV: 82.7 fL (ref 80.0–100.0)
PLATELETS: 306 10*3/uL (ref 150–440)
RBC: 4.7 MIL/uL (ref 3.80–5.20)
RDW: 14 % (ref 11.5–14.5)
WBC: 6.6 10*3/uL (ref 3.6–11.0)

## 2015-03-10 LAB — BASIC METABOLIC PANEL
Anion gap: 3 — ABNORMAL LOW (ref 5–15)
BUN: 13 mg/dL (ref 6–20)
CALCIUM: 9.3 mg/dL (ref 8.9–10.3)
CO2: 25 mmol/L (ref 22–32)
CREATININE: 1.32 mg/dL — AB (ref 0.44–1.00)
Chloride: 114 mmol/L — ABNORMAL HIGH (ref 101–111)
GFR calc Af Amer: 56 mL/min — ABNORMAL LOW (ref >60)
GFR calc non Af Amer: 48 mL/min — ABNORMAL LOW (ref >60)
Glucose, Bld: 110 mg/dL — ABNORMAL HIGH (ref 65–99)
POTASSIUM: 4.3 mmol/L (ref 3.5–5.1)
Sodium: 142 mmol/L (ref 135–145)

## 2015-03-10 LAB — TROPONIN I

## 2015-03-10 MED ORDER — BENZONATATE 200 MG PO CAPS: 200.0000 mg | ORAL_CAPSULE | Freq: Three times a day (TID) | ORAL | Status: AC | PRN

## 2015-03-10 MED ORDER — PSEUDOEPHEDRINE HCL 30 MG PO TABS: 30.0000 mg | ORAL_TABLET | Freq: Four times a day (QID) | ORAL | Status: AC | PRN

## 2015-03-10 NOTE — ED Notes (Addendum)
Pt c/o dry cough for the past week with tenderness to the left lateral rib with movement, deep breathing and "when I lay down".. Pt states she started having pain in chest yesterday after smoking crack cocaine.

## 2015-03-10 NOTE — ED Provider Notes (Signed)
[facility] Emergency Department Provider Note     Time seen: ----------------------------------------- 2:17 PM on 03/10/2015 -----------------------------------------    I have reviewed the triage vital signs and the nursing notes.   HISTORY  Chief Complaint Cough and Chest Pain    HPI Rachael Strickland is a 44 y.o. female who presents ER for cough or past week with tenderness in the left ribs with movement and deep breathing. Pain is also worse when she lays down, patient does note that she smokes crack cocaine and she is going to detox and for this. Patient started having pain in the chest chest after smoking crack. She denies fevers chills, has had nasal congestion and cough.   Past Medical History  Diagnosis Date  . Hypertension   . Elevated lipids   . Diabetes mellitus without complication (HCC)   . GERD (gastroesophageal reflux disease)     Patient Active Problem List   Diagnosis Date Noted  . Traumatic arthritis of knee 10/27/2014    Past Surgical History  Procedure Laterality Date  . Knee surgery      x 6  . Leg tendon surgery Right     has screws  . Abdominal hysterectomy    . Ctr Bilateral   . Tonsillectomy    . Ovarian cysts    . Total knee arthroplasty Right 10/27/2014    Procedure: TOTAL KNEE ARTHROPLASTY;  Surgeon: Kennedy BuckerMichael Menz, MD;  Location: [facility] ORS;  Service: Orthopedics;  Laterality: Right;  . Ctr Bilateral   . Knee closed reduction Right 11/26/2014    Procedure: CLOSED MANIPULATION KNEE;  Surgeon: Kennedy BuckerMichael Menz, MD;  Location: [facility] ORS;  Service: Orthopedics;  Laterality: Right;    Allergies Ibuprofen; Oxycontin; Sulfa antibiotics; and Zantac  Social History Social History  Substance Use Topics  . Smoking status: Former Games developermoker  . Smokeless tobacco: None  . Alcohol Use: No    Review of Systems Constitutional: Negative for fever. Eyes: Negative for visual changes. ENT: Negative for sore throat. Positive for  congestion Cardiovascular: Negative for chest pain. Respiratory: Negative for shortness of breath. Positive for cough Gastrointestinal: Negative for abdominal pain, vomiting and diarrhea. Genitourinary: Negative for dysuria. Musculoskeletal: Negative for back pain. Skin: Negative for rash. Neurological: Negative for headaches, focal weakness or numbness.  10-point ROS otherwise negative.  ____________________________________________   PHYSICAL EXAM:  VITAL SIGNS: ED Triage Vitals  Enc Vitals Group     BP 03/10/15 1218 119/58 mmHg     Pulse Rate 03/10/15 1217 73     Resp 03/10/15 1217 18     Temp 03/10/15 1217 98.5 F (36.9 C)     Temp Source 03/10/15 1217 Oral     SpO2 03/10/15 1217 98 %     Weight 03/10/15 1217 285 lb (129.275 kg)     Height 03/10/15 1217 5\' 4"  (1.626 m)     Head Cir --      Peak Flow --      Pain Score 03/10/15 1217 7     Pain Loc --      Pain Edu? --      Excl. in GC? --     Constitutional: Alert and oriented. Well appearing and in no distress. Eyes: Conjunctivae are normal. PERRL. Normal extraocular movements. ENT   Head: Normocephalic and atraumatic.   Nose: No congestion/rhinnorhea.   Mouth/Throat: Mucous membranes are moist.   Neck: No stridor. Cardiovascular: Normal rate, regular rhythm. Normal and symmetric distal pulses are present in all extremities. No  murmurs, rubs, or gallops. Respiratory: Normal respiratory effort without tachypnea nor retractions. Breath sounds are clear and equal bilaterally. No wheezes/rales/rhonchi. Gastrointestinal: Soft and nontender. No distention. No abdominal bruits.  Musculoskeletal: Nontender with normal range of motion in all extremities. No joint effusions.  No lower extremity tenderness nor edema. Neurologic:  Normal speech and language. No gross focal neurologic deficits are appreciated. Speech is normal. No gait instability. Skin:  Skin is warm, dry and intact. No rash noted. Psychiatric:  Mood and affect are normal. Speech and behavior are normal. Patient exhibits appropriate insight and judgment. ____________________________________________  EKG: Interpreted by me. Normal sinus rhythm rate 78 bpm, normal PR interval, normal uterus with, normal QT interval. Possible anterior infarct age and determinate.  ____________________________________________  ED COURSE:  Pertinent labs & imaging results that were available during my care of the patient were reviewed by me and considered in my medical decision making (see chart for details). Patient is in no acute distress, will check cardiac labs and chest x-ray. ____________________________________________    LABS (pertinent positives/negatives)  Labs Reviewed  BASIC METABOLIC PANEL - Abnormal; Notable for the following:    Chloride 114 (*)    Glucose, Bld 110 (*)    Creatinine, Ser 1.32 (*)    GFR calc non Af Amer 48 (*)    GFR calc Af Amer 56 (*)    Anion gap 3 (*)    All other components within normal limits  CBC  TROPONIN I    RADIOLOGY  Chest x-ray is normal  ____________________________________________  FINAL ASSESSMENT AND PLAN  Pleuritic pain, URI, cocaine abuse  Plan: Patient with labs and imaging as dictated above. Patient is in no acute distress, labs and chest x-ray here are normal. She'll be discharged with Tessalon and Sudafed and encouraged to have close follow-up with detox.   Emily Filbert, MD   Emily Filbert, MD 03/10/15 [phone]

## 2015-03-10 NOTE — Discharge Instructions (Signed)
Upper Respiratory Infection, Adult Most upper respiratory infections (URIs) are a viral infection of the air passages leading to the lungs. A URI affects the nose, throat, and upper air passages. The most common type of URI is nasopharyngitis and is typically referred to as "the common cold." URIs run their course and usually go away on their own. Most of the time, a URI does not require medical attention, but sometimes a bacterial infection in the upper airways can follow a viral infection. This is called a secondary infection. Sinus and middle ear infections are common types of secondary upper respiratory infections. Bacterial pneumonia can also complicate a URI. A URI can worsen asthma and chronic obstructive pulmonary disease (COPD). Sometimes, these complications can require emergency medical care and may be life threatening.  CAUSES Almost all URIs are caused by viruses. A virus is a type of germ and can spread from one person to another.  RISKS FACTORS You may be at risk for a URI if:   You smoke.   You have chronic heart or lung disease.  You have a weakened defense (immune) system.   You are very young or very old.   You have nasal allergies or asthma.  You work in crowded or poorly ventilated areas.  You work in health care facilities or schools. SIGNS AND SYMPTOMS  Symptoms typically develop 2-3 days after you come in contact with a cold virus. Most viral URIs last 7-10 days. However, viral URIs from the influenza virus (flu virus) can last 14-18 days and are typically more severe. Symptoms may include:   Runny or stuffy (congested) nose.   Sneezing.   Cough.   Sore throat.   Headache.   Fatigue.   Fever.   Loss of appetite.   Pain in your forehead, behind your eyes, and over your cheekbones (sinus pain).  Muscle aches.  DIAGNOSIS  Your health care provider may diagnose a URI by:  Physical exam.  Tests to check that your symptoms are not due to  another condition such as:  Strep throat.  Sinusitis.  Pneumonia.  Asthma. TREATMENT  A URI goes away on its own with time. It cannot be cured with medicines, but medicines may be prescribed or recommended to relieve symptoms. Medicines may help:  Reduce your fever.  Reduce your cough.  Relieve nasal congestion. HOME CARE INSTRUCTIONS   Take medicines only as directed by your health care provider.   Gargle warm saltwater or take cough drops to comfort your throat as directed by your health care provider.  Use a warm mist humidifier or inhale steam from a shower to increase air moisture. This may make it easier to breathe.  Drink enough fluid to keep your urine clear or pale yellow.   Eat soups and other clear broths and maintain good nutrition.   Rest as needed.   Return to work when your temperature has returned to normal or as your health care provider advises. You may need to stay home longer to avoid infecting others. You can also use a face mask and careful hand washing to prevent spread of the virus.  Increase the usage of your inhaler if you have asthma.   Do not use any tobacco products, including cigarettes, chewing tobacco, or electronic cigarettes. If you need help quitting, ask your health care provider. PREVENTION  The best way to protect yourself from getting a cold is to practice good hygiene.   Avoid oral or hand contact with people with cold  symptoms.   Wash your hands often if contact occurs.  There is no clear evidence that vitamin C, vitamin E, echinacea, or exercise reduces the chance of developing a cold. However, it is always recommended to get plenty of rest, exercise, and practice good nutrition.  SEEK MEDICAL CARE IF:   You are getting worse rather than better.   Your symptoms are not controlled by medicine.   You have chills.  You have worsening shortness of breath.  You have brown or red mucus.  You have yellow or brown nasal  discharge.  You have pain in your face, especially when you bend forward.  You have a fever.  You have swollen neck glands.  You have pain while swallowing.  You have white areas in the back of your throat. SEEK IMMEDIATE MEDICAL CARE IF:   You have severe or persistent:  Headache.  Ear pain.  Sinus pain.  Chest pain.  You have chronic lung disease and any of the following:  Wheezing.  Prolonged cough.  Coughing up blood.  A change in your usual mucus.  You have a stiff neck.  You have changes in your:  Vision.  Hearing.  Thinking.  Mood. MAKE SURE YOU:   Understand these instructions.  Will watch your condition.  Will get help right away if you are not doing well or get worse.   This information is not intended to replace advice given to you by your health care provider. Make sure you discuss any questions you have with your health care provider.   Document Released: 09/20/2000 Document Revised: 08/11/2014 Document Reviewed: 07/02/2013 Elsevier Interactive Patient Education 2016 [employer] Pleurisy Pleurisy is an inflammation and swelling of the lining of the lungs (pleura). Because of this inflammation, it hurts to breathe. It can be aggravated by coughing, laughing, or deep breathing. Pleurisy is often caused by an underlying infection or disease.  HOME CARE INSTRUCTIONS  Monitor your pleurisy for any changes. The following actions may help to alleviate any discomfort you are experiencing:  Medicine may help with pain. Only take over-the-counter or prescription medicines for pain, discomfort, or fever as directed by your health care provider.  Only take antibiotic medicine as directed. Make sure to finish it even if you start to feel better. SEEK MEDICAL CARE IF:   Your pain is not controlled with medicine or is increasing.  You have an increase in pus-like (purulent) secretions brought up with coughing. SEEK IMMEDIATE MEDICAL CARE IF:    You have blue or dark lips, fingernails, or toenails.  You are coughing up blood.  You have increased difficulty breathing.  You have continuing pain unrelieved by medicine or pain lasting more than 1 week.  You have pain that radiates into your neck, arms, or jaw.  You develop increased shortness of breath or wheezing.  You develop a fever, rash, vomiting, fainting, or other serious symptoms. MAKE SURE YOU:  Understand these instructions.   Will watch your condition.   Will get help right away if you are not doing well or get worse.    This information is not intended to replace advice given to you by your health care provider. Make sure you discuss any questions you have with your health care provider.   Document Released: 03/27/2005 Document Revised: 11/27/2012 Document Reviewed: 09/08/2012 Elsevier Interactive Patient Education [employer]

## 2015-03-10 NOTE — ED Notes (Signed)
MD Williams at bedside.  

## 2015-04-14 IMAGING — CR DG CHEST 2V
1 series · 2 of 2 positions shown · non-contrast
Comparison: 10/11/2013

CLINICAL DATA: Chest pain, CHF

EXAM:
CHEST  2 VIEW

[Series 1: dxr chest pa (or ap) and lateral · 0.14mm/px · 2 of 2 slices shown]
[im 1/2]
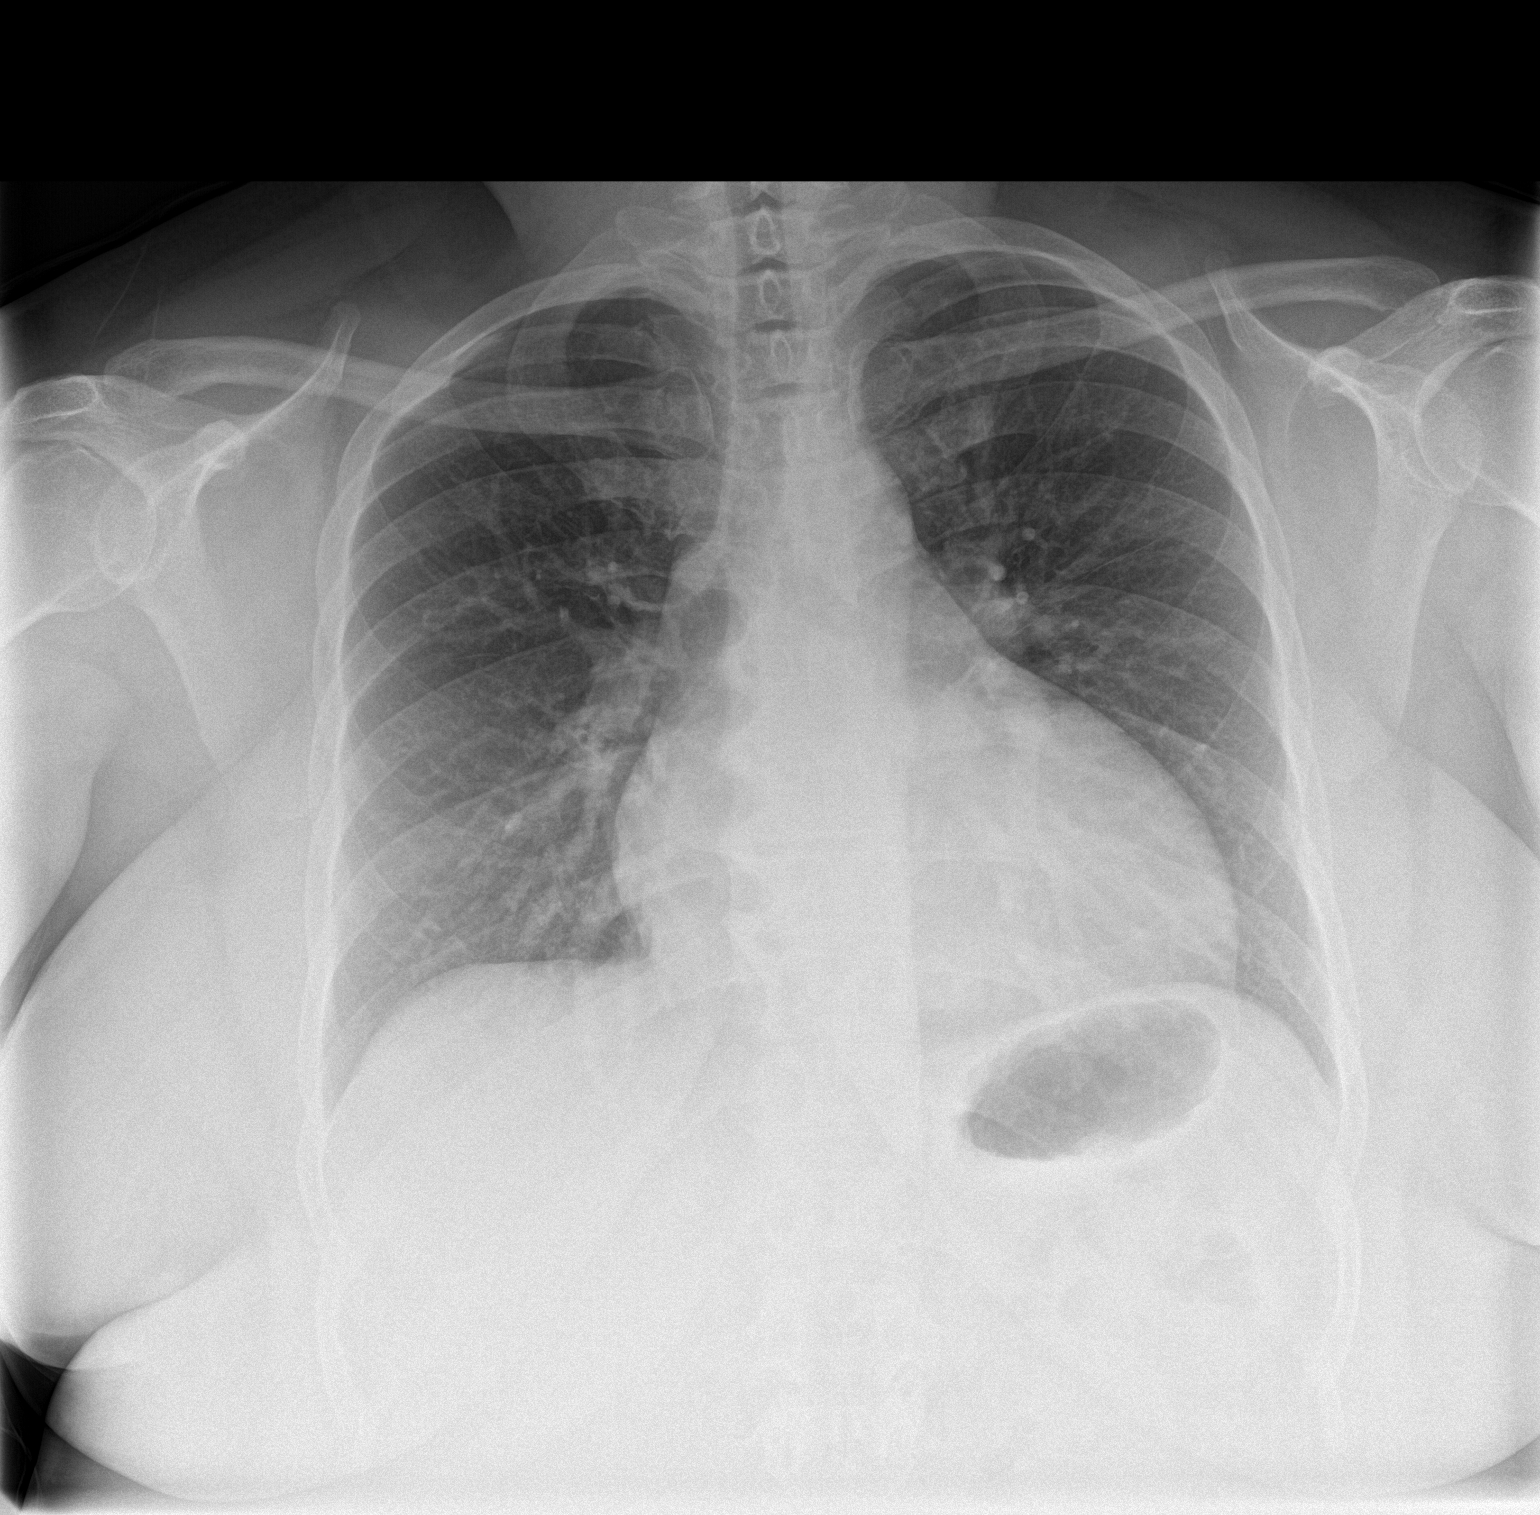
[im 2/2]
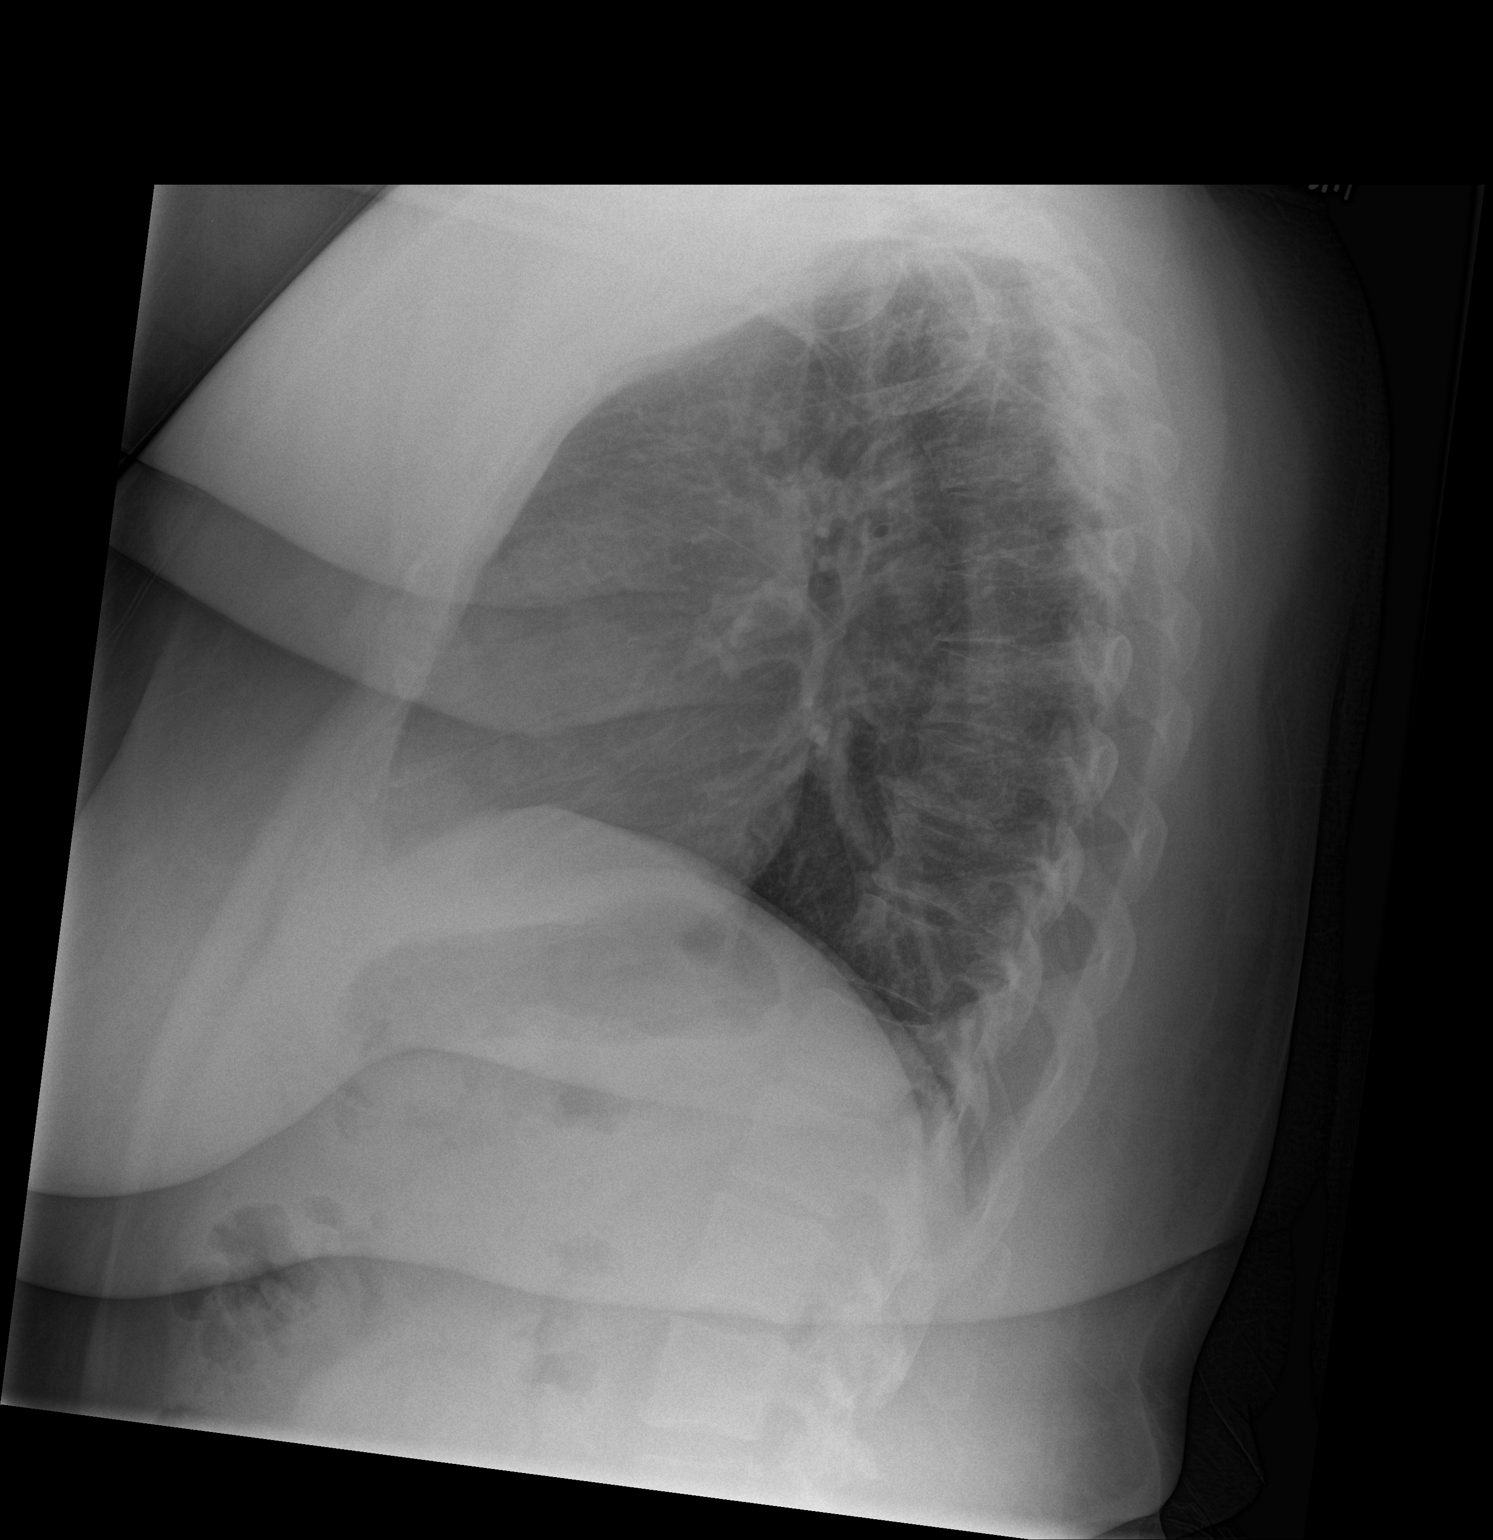

[2 of 2 positions shown; findings below may reference images not displayed]

FINDINGS: Enlargement of cardiac silhouette.

Mediastinal contours and pulmonary vascularity normal.

Minimal peribronchial thickening.

No infiltrate, pleural effusion or pneumothorax.

Bones unremarkable.
IMPRESSION: Enlargement of cardiac silhouette with minimal chronic bronchitic
changes.

## 2015-09-28 IMAGING — CT CT KNEE*R* W/O CM
3 of 7 series · 14 of 33 positions shown, 17 images · non-contrast
Comparison: None.

CLINICAL DATA: Preop right knee replacement

EXAM:
CT OF THE MR knee 05/12/2014 KNEE WITHOUT CONTRAST
TECHNIQUE: Multidetector CT imaging of the RIGHT knee was performed according
to the standard protocol. Multiplanar CT image reconstructions were
also generated.

[Series 7: knee · axial · 0.39mm/px · z∈[-115,+60]mm · 6 of 491 slices shown, 8 images]
[im 71/491  soft-tissue]
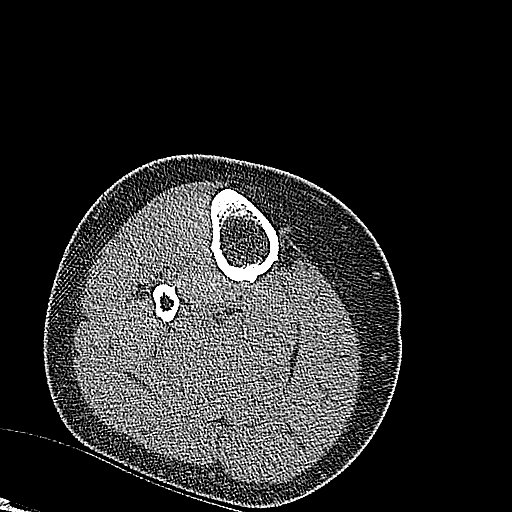
[im 71/491  bone]
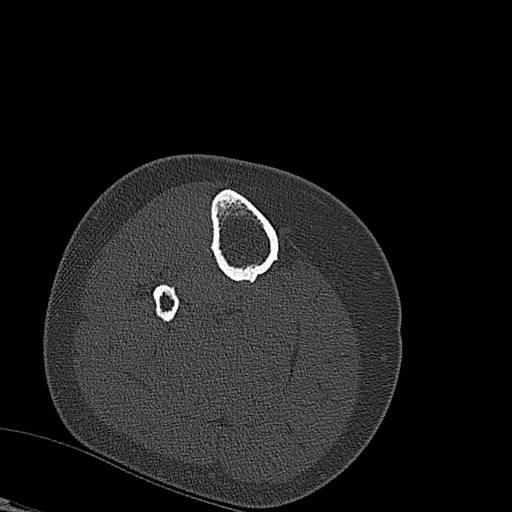
[im 141/491  bone]
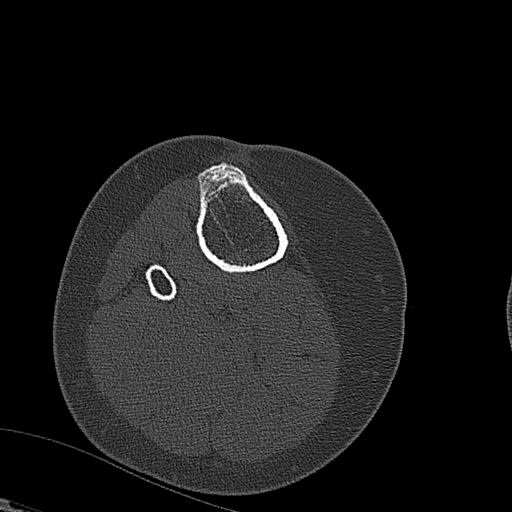
[im 211/491  bone]
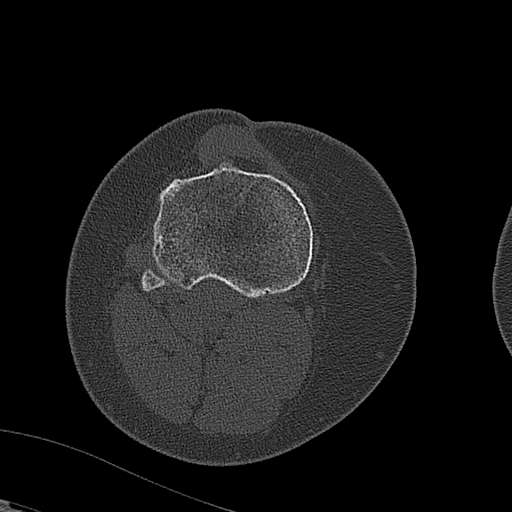
[im 281/491  bone]
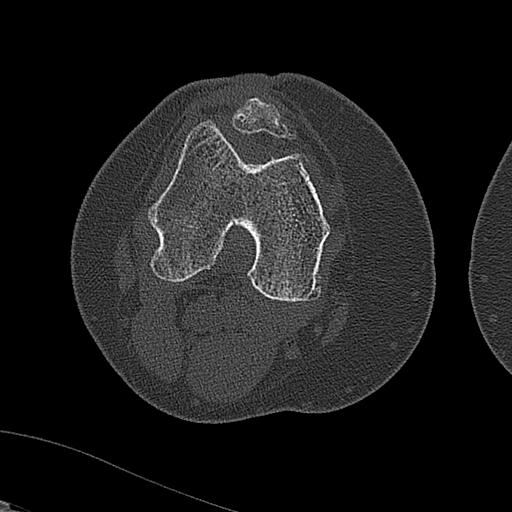
[im 351/491  soft-tissue]
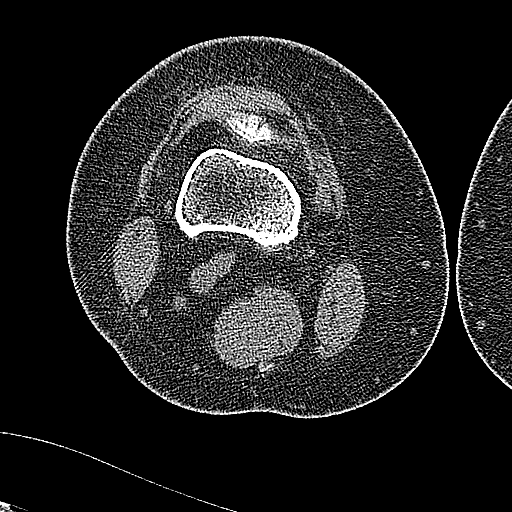
[im 351/491  bone]
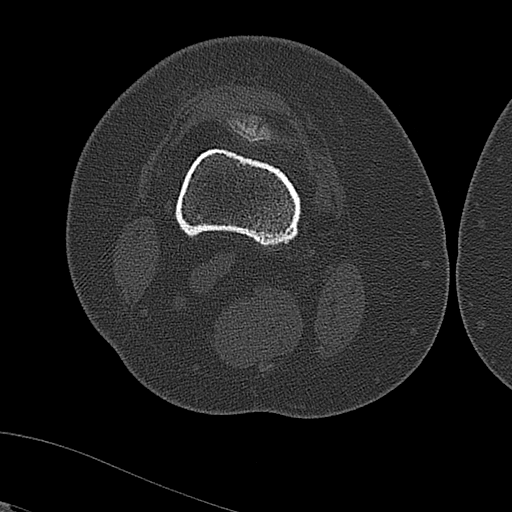
[im 421/491  bone]
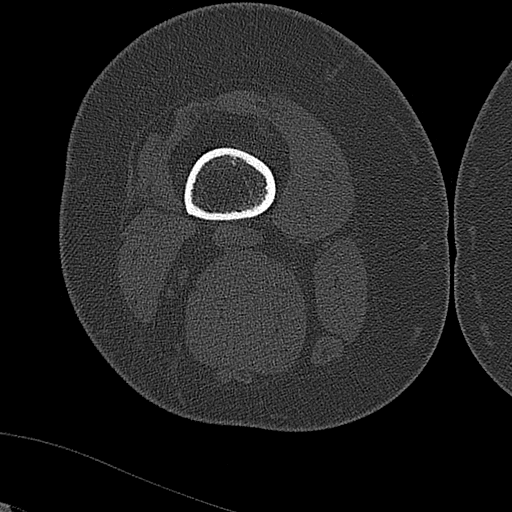

[Series 603: cor st · coronal · 0.35mm/px · 3 of 72 slices shown]
[im 15/72  bone]
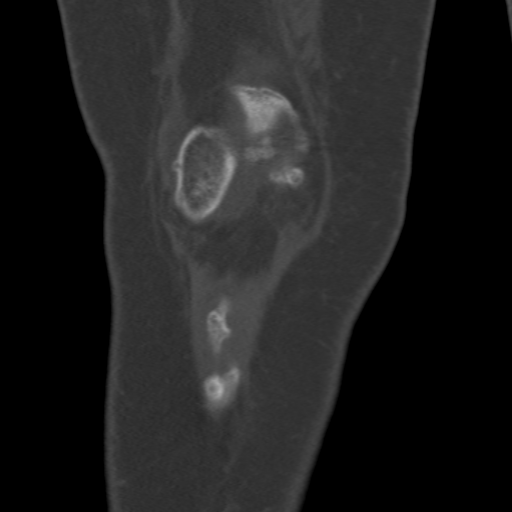
[im 29/72  bone]
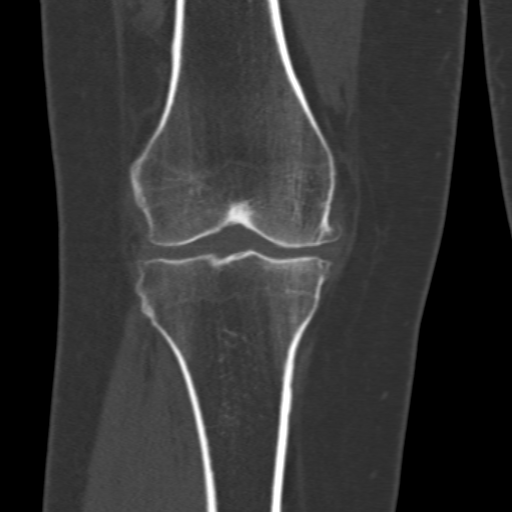
[im 43/72  bone]
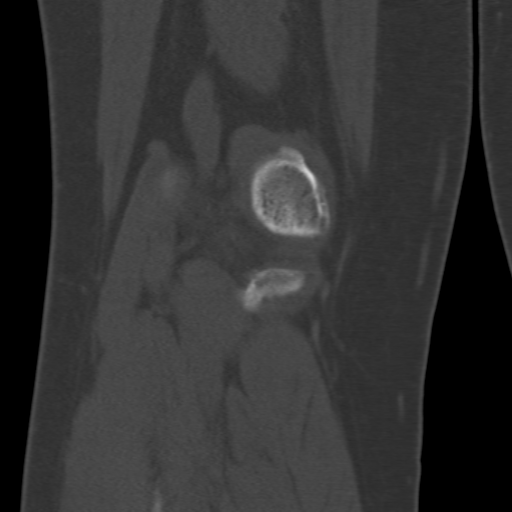

[Series 604: sag st · sagittal · 0.35mm/px · 5 of 61 slices shown, 6 images]
[im 21/61  bone]
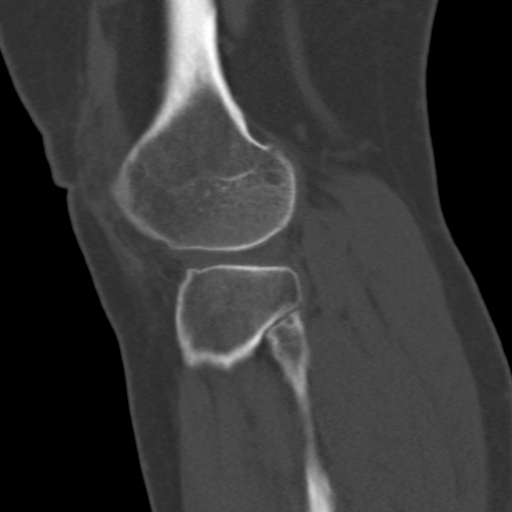
[im 26/61  bone]
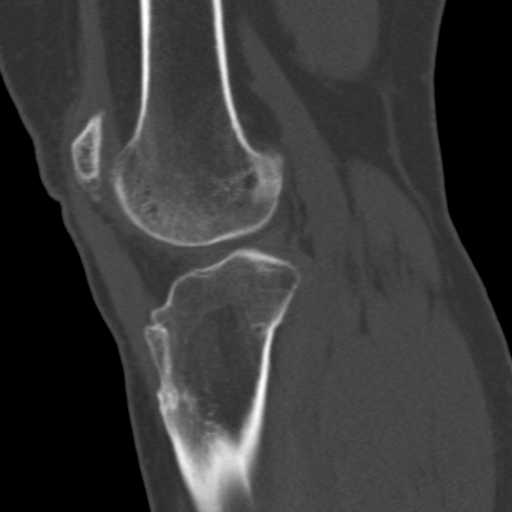
[im 31/61  soft-tissue]
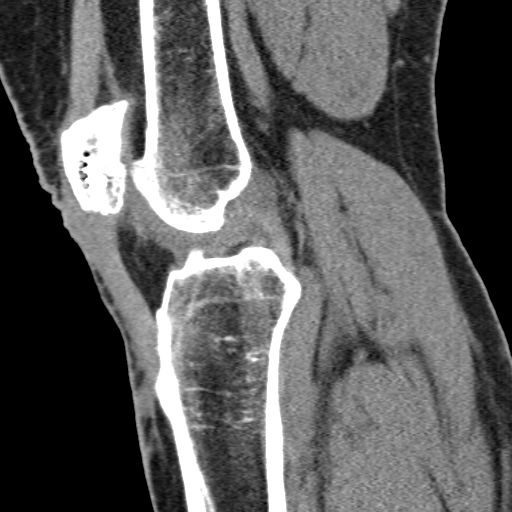
[im 31/61  bone]
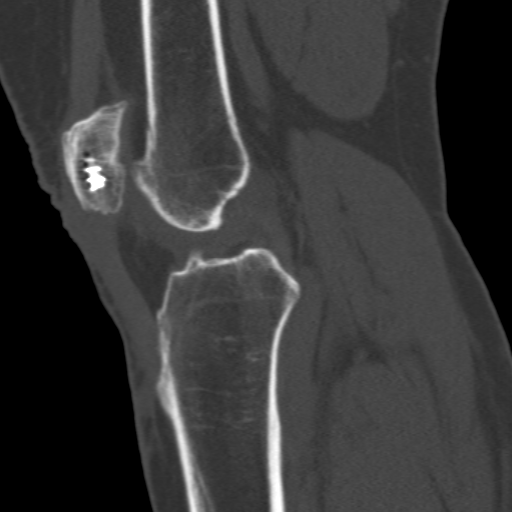
[im 36/61  bone]
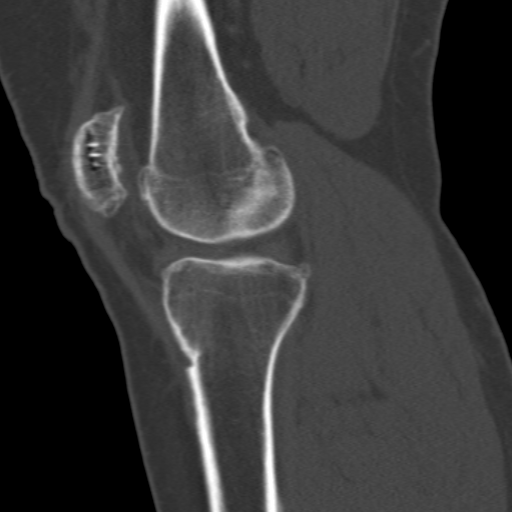
[im 41/61  bone]
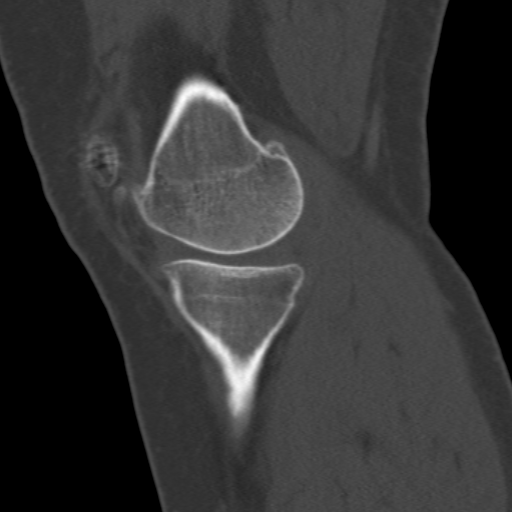

[14 of 33 positions shown; findings below may reference images not displayed]

FINDINGS: The hip demonstrates no fracture or dislocation. There is no lytic
or blastic lesion.

The right knee demonstrates no fracture or dislocation. There is no
lytic or blastic lesion. There is tricompartmental osteoarthritis of
the right knee most severe in the medial femorotibial compartment.
There are 2 screws transfixing a healed patellar fracture.

The ankle demonstrates no fracture or dislocation. There is no lytic
or blastic lesion. There are mild degenerative changes of the medial
aspect of the navicular-medial cuneiform articulation.
IMPRESSION: Tricompartmental osteoarthritis of the right knee.

## 2015-11-06 IMAGING — CR DG KNEE 1-2V*R*
1 series · 2 of 2 positions shown · non-contrast
Comparison: None.

CLINICAL DATA: Postop right knee

EXAM:
RIGHT KNEE - 1-2 VIEW

[Series 1: ap · 0.17mm/px · 2 of 2 slices shown]
[im 1/2]
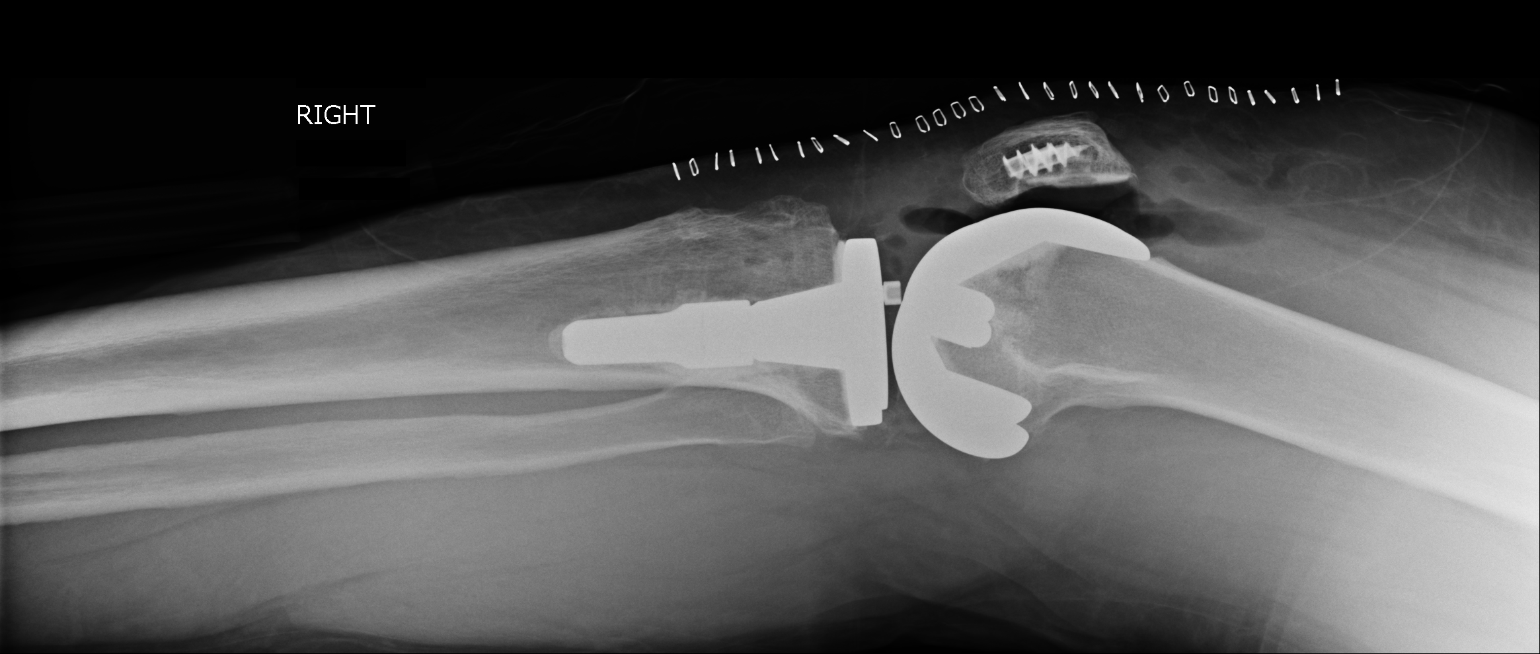
[im 2/2]
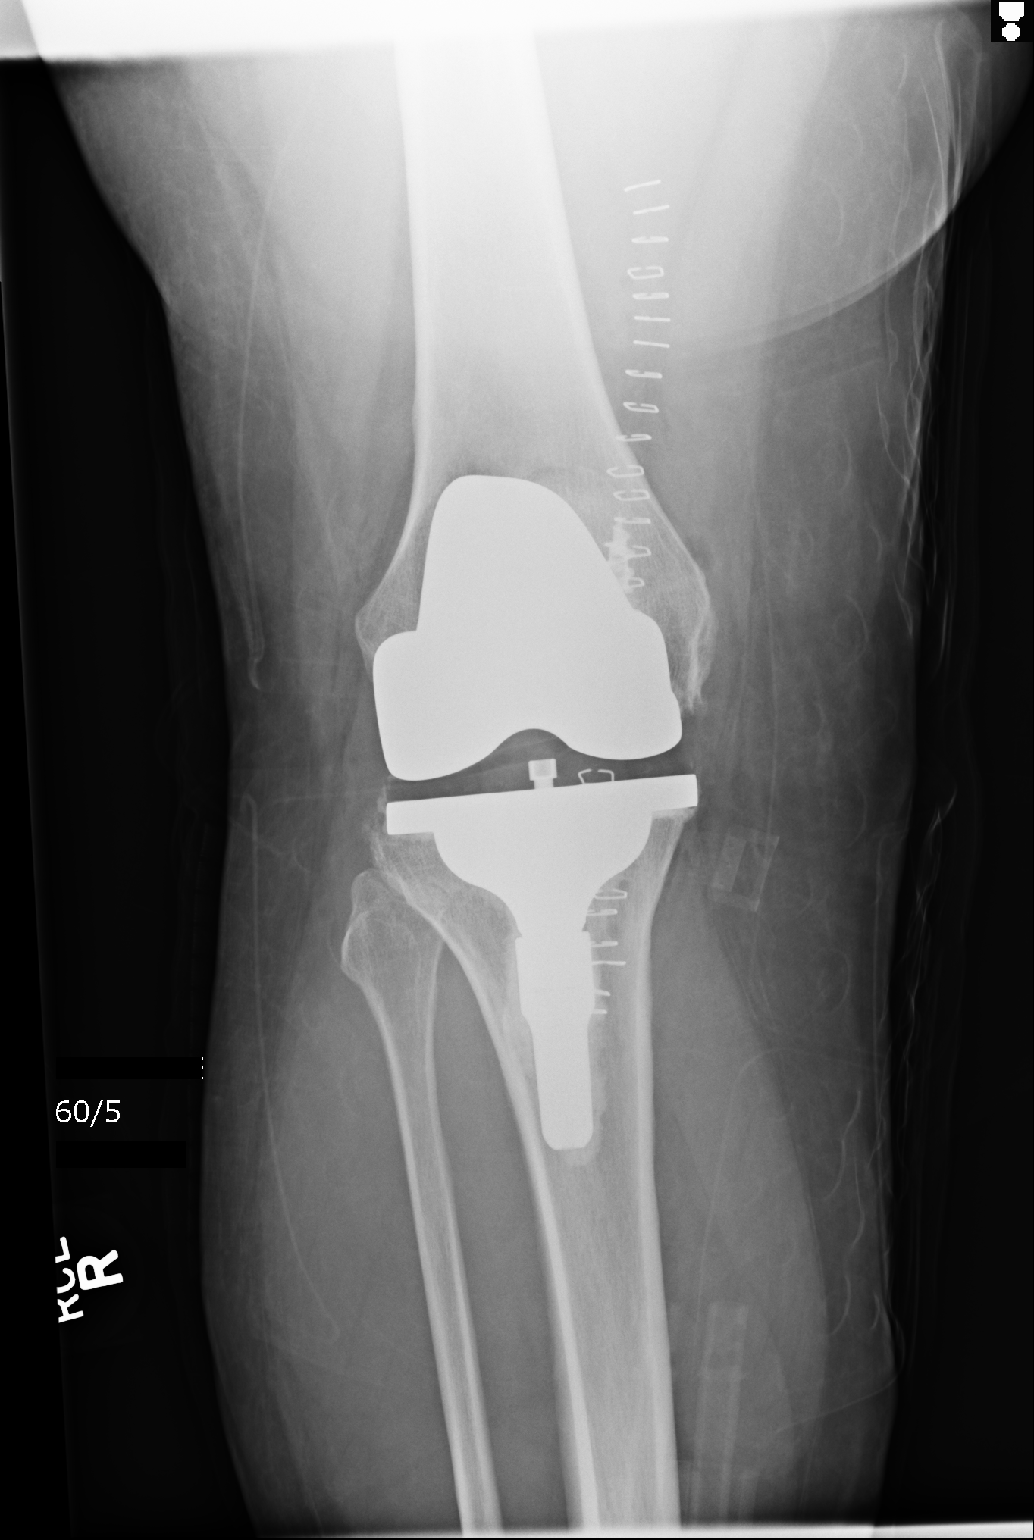

[2 of 2 positions shown; findings below may reference images not displayed]

FINDINGS: Two views of the right knee submitted. There is a right knee
prosthesis with anatomic alignment. Postsurgical changes are noted
with midline skin staples.
IMPRESSION: Right knee prosthesis with anatomic alignment. Postsurgical changes
are noted.

## 2016-03-19 IMAGING — CR DG CHEST 2V
1 series · 2 of 2 positions shown · non-contrast
Comparison: Prior chest x-ray 04/04/2014

CLINICAL DATA: 44-year-old female with left-sided chest pain and
cough for the past several days.

EXAM:
CHEST  2 VIEW

[Series 1: dg chest 2 view · 0.14mm/px · 2 of 2 slices shown]
[im 1/2]
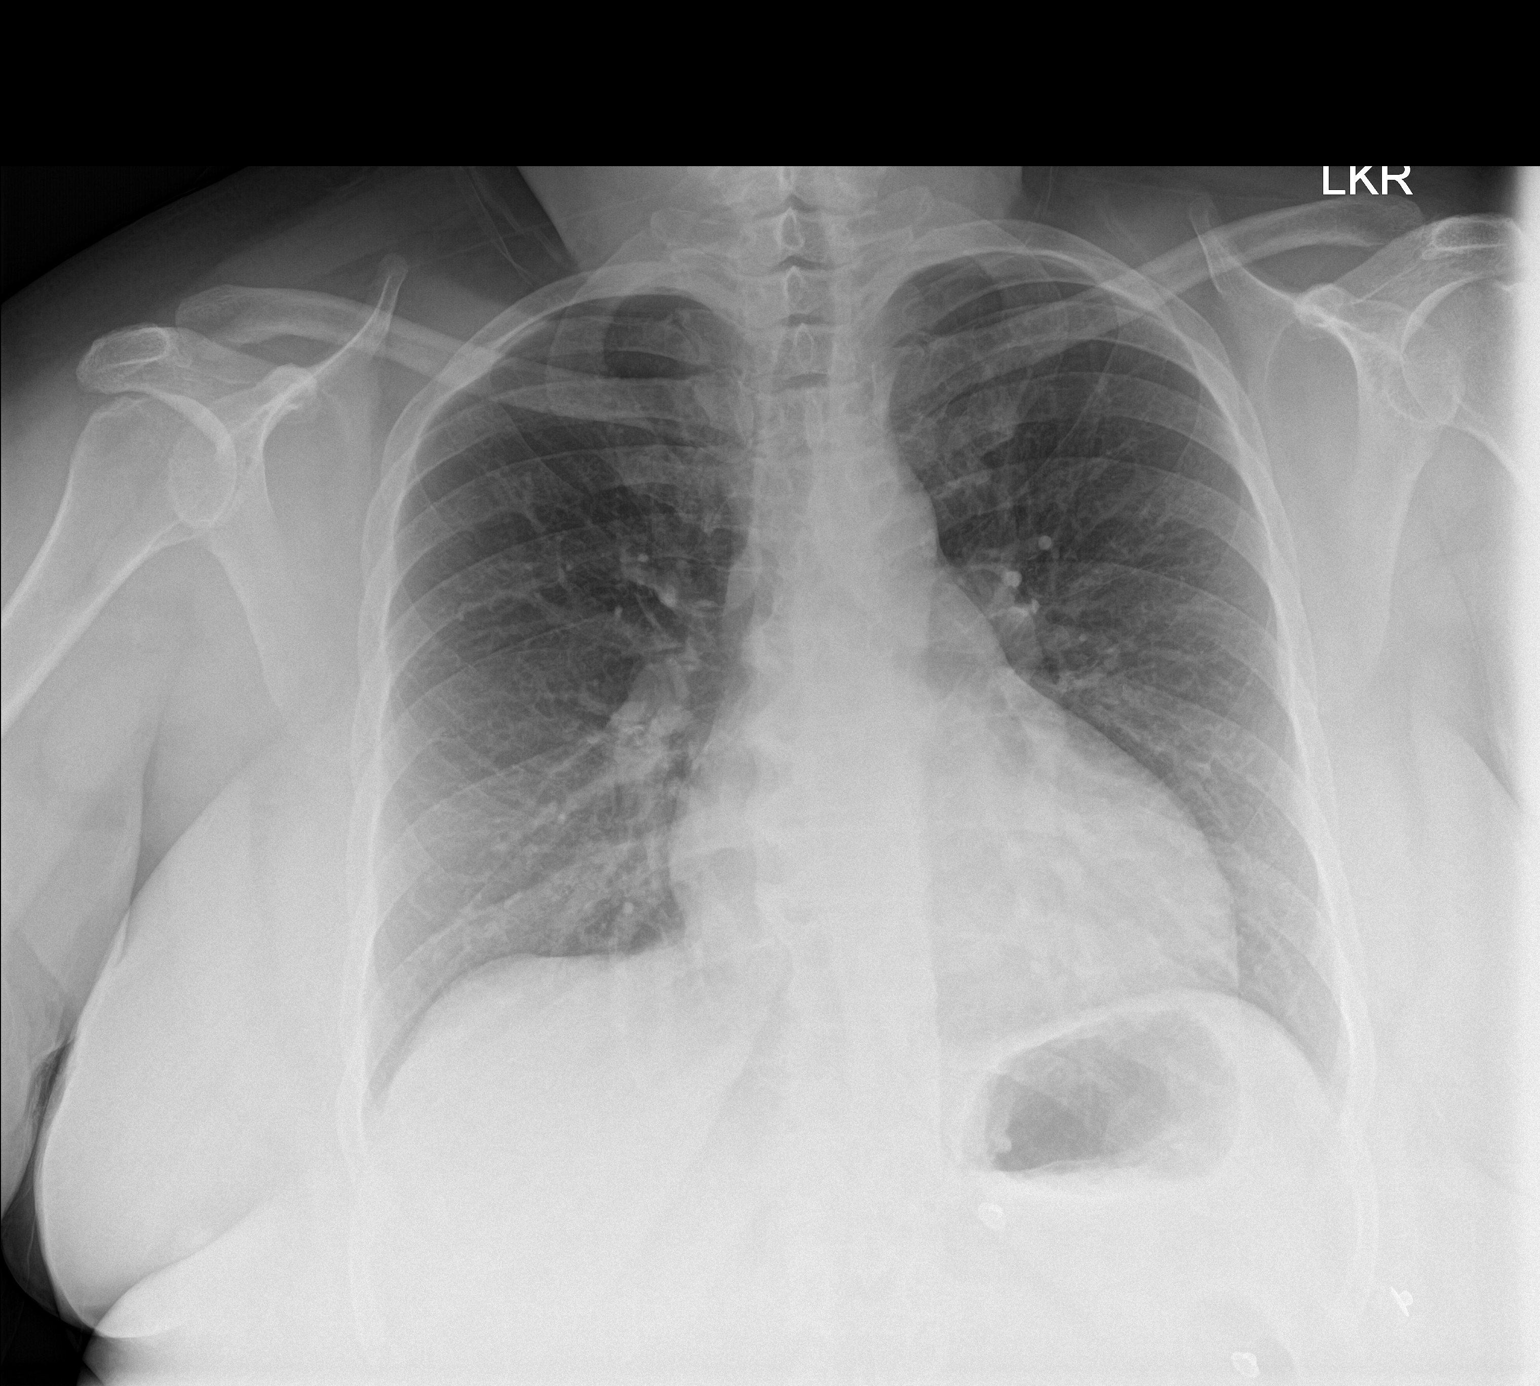
[im 2/2]
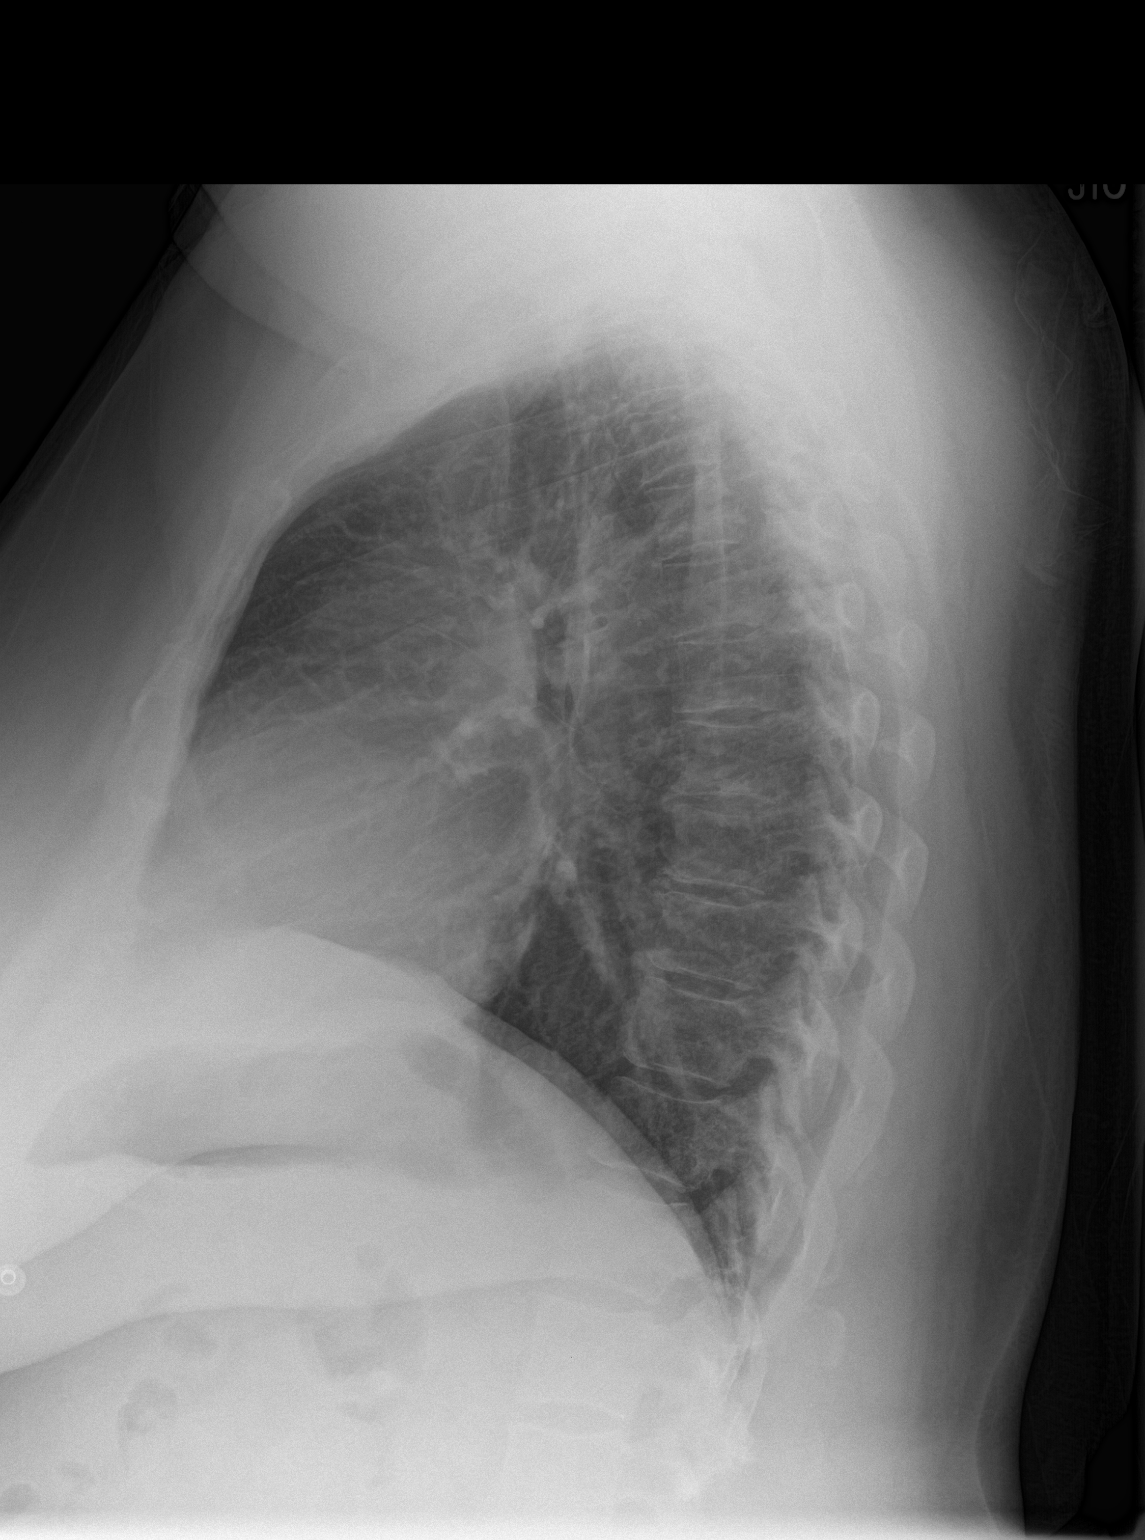

[2 of 2 positions shown; findings below may reference images not displayed]

FINDINGS: Stable borderline cardiomegaly. No evidence of pulmonary edema,
focal airspace consolidation, pleural effusion or pneumothorax. Mild
central bronchitic change is similar compared to prior. No
suspicious pulmonary mass or nodule. No acute osseous abnormality.
IMPRESSION: Stable chest x-ray without evidence of acute cardiopulmonary
process.
